# Patient Record
Sex: Male | Born: 1955 | Race: White | Hispanic: No | Marital: Single | Smoking: Current every day smoker
Health system: Southern US, Community
[De-identification: ages and names within clinical notes are randomized; demographics above are authoritative.]

## PROBLEM LIST (undated history)

## (undated) DIAGNOSIS — S5290XA Unspecified fracture of unspecified forearm, initial encounter for closed fracture: Secondary | ICD-10-CM

## (undated) DIAGNOSIS — S72009A Fracture of unspecified part of neck of unspecified femur, initial encounter for closed fracture: Secondary | ICD-10-CM

## (undated) DIAGNOSIS — K257 Chronic gastric ulcer without hemorrhage or perforation: Secondary | ICD-10-CM

## (undated) DIAGNOSIS — F1910 Other psychoactive substance abuse, uncomplicated: Secondary | ICD-10-CM

## (undated) DIAGNOSIS — I82409 Acute embolism and thrombosis of unspecified deep veins of unspecified lower extremity: Secondary | ICD-10-CM

## (undated) DIAGNOSIS — F200 Paranoid schizophrenia: Secondary | ICD-10-CM

## (undated) DIAGNOSIS — R339 Retention of urine, unspecified: Secondary | ICD-10-CM

## (undated) DIAGNOSIS — S86019A Strain of unspecified Achilles tendon, initial encounter: Secondary | ICD-10-CM

## (undated) HISTORY — DX: Acute embolism and thrombosis of unspecified deep veins of unspecified lower extremity: I82.409

## (undated) HISTORY — DX: Strain of unspecified achilles tendon, initial encounter: S86.019A

## (undated) HISTORY — PX: HIP SURGERY: SHX245

## (undated) HISTORY — DX: Other psychoactive substance abuse, uncomplicated: F19.10

## (undated) HISTORY — DX: Fracture of unspecified part of neck of unspecified femur, initial encounter for closed fracture: S72.009A

## (undated) HISTORY — DX: Unspecified fracture of unspecified forearm, initial encounter for closed fracture: S52.90XA

## (undated) HISTORY — DX: Retention of urine, unspecified: R33.9

---

## 2010-08-05 ENCOUNTER — Inpatient Hospital Stay (HOSPITAL_COMMUNITY)
Admission: EM | Admit: 2010-08-05 | Discharge: 2010-08-11 | DRG: 482 | Disposition: A | Discharge status: Home Health | Payer: Self-pay | Attending: Orthopaedic Surgery | Admitting: Orthopaedic Surgery

## 2010-08-05 DIAGNOSIS — Z5900 Homelessness unspecified: Secondary | ICD-10-CM

## 2010-08-05 DIAGNOSIS — R339 Retention of urine, unspecified: Secondary | ICD-10-CM | POA: Diagnosis present

## 2010-08-05 DIAGNOSIS — W101XXA Fall (on)(from) sidewalk curb, initial encounter: Secondary | ICD-10-CM | POA: Diagnosis present

## 2010-08-05 DIAGNOSIS — F17200 Nicotine dependence, unspecified, uncomplicated: Secondary | ICD-10-CM | POA: Diagnosis present

## 2010-08-05 DIAGNOSIS — S72143A Displaced intertrochanteric fracture of unspecified femur, initial encounter for closed fracture: Principal | ICD-10-CM | POA: Diagnosis present

## 2010-08-05 DIAGNOSIS — I10 Essential (primary) hypertension: Secondary | ICD-10-CM | POA: Diagnosis present

## 2010-08-06 ENCOUNTER — Emergency Department (HOSPITAL_COMMUNITY): Payer: Self-pay

## 2010-08-06 ENCOUNTER — Inpatient Hospital Stay (HOSPITAL_COMMUNITY): Payer: Self-pay

## 2010-08-06 LAB — COMPREHENSIVE METABOLIC PANEL
ALT: 19 U/L (ref 0–53)
Alkaline Phosphatase: 80 U/L (ref 39–117)
Chloride: 102 mEq/L (ref 96–112)
Glucose, Bld: 122 mg/dL — ABNORMAL HIGH (ref 70–99)
Potassium: 3.9 mEq/L (ref 3.5–5.1)
Sodium: 137 mEq/L (ref 135–145)
Total Bilirubin: 0.6 mg/dL (ref 0.3–1.2)
Total Protein: 7.5 g/dL (ref 6.0–8.3)

## 2010-08-06 LAB — DIFFERENTIAL
Basophils Relative: 0 % (ref 0–1)
Eosinophils Absolute: 0 10*3/uL (ref 0.0–0.7)
Eosinophils Relative: 0 % (ref 0–5)
Lymphs Abs: 1.8 10*3/uL (ref 0.7–4.0)
Monocytes Absolute: 0.9 10*3/uL (ref 0.1–1.0)
Monocytes Relative: 5 % (ref 3–12)

## 2010-08-06 LAB — MRSA PCR SCREENING: MRSA by PCR: NEGATIVE

## 2010-08-06 LAB — CBC
MCH: 30.7 pg (ref 26.0–34.0)
MCHC: 32.9 g/dL (ref 30.0–36.0)
MCV: 93.5 fL (ref 78.0–100.0)
Platelets: 650 10*3/uL — ABNORMAL HIGH (ref 150–400)
RBC: 3.71 MIL/uL — ABNORMAL LOW (ref 4.22–5.81)
RDW: 13.5 % (ref 11.5–15.5)

## 2010-08-06 LAB — PROTIME-INR: INR: 1.18 (ref 0.00–1.49)

## 2010-08-06 LAB — TYPE AND SCREEN: ABO/RH(D): A POS

## 2010-08-06 LAB — SAMPLE TO BLOOD BANK

## 2010-08-06 LAB — ABO/RH: ABO/RH(D): A POS

## 2010-08-07 LAB — URINALYSIS, ROUTINE W REFLEX MICROSCOPIC
Nitrite: NEGATIVE
Specific Gravity, Urine: 1.01 (ref 1.005–1.030)
Urobilinogen, UA: 1 mg/dL (ref 0.0–1.0)
pH: 6.5 (ref 5.0–8.0)

## 2010-08-07 LAB — URINE MICROSCOPIC-ADD ON

## 2010-08-08 LAB — CBC
MCHC: 33 g/dL (ref 30.0–36.0)
RDW: 13.1 % (ref 11.5–15.5)
WBC: 18.1 10*3/uL — ABNORMAL HIGH (ref 4.0–10.5)

## 2010-08-08 LAB — COMPREHENSIVE METABOLIC PANEL
ALT: 21 U/L (ref 0–53)
AST: 30 U/L (ref 0–37)
Albumin: 2.7 g/dL — ABNORMAL LOW (ref 3.5–5.2)
Calcium: 8.4 mg/dL (ref 8.4–10.5)
GFR calc Af Amer: >60 mL/min (ref >60)
Sodium: 132 mEq/L — ABNORMAL LOW (ref 135–145)
Total Protein: 6.5 g/dL (ref 6.0–8.3)

## 2010-08-08 LAB — MAGNESIUM: Magnesium: 1.8 mg/dL (ref 1.5–2.5)

## 2010-08-09 LAB — CBC
HCT: 25.1 % — ABNORMAL LOW (ref 39.0–52.0)
Hemoglobin: 8.2 g/dL — ABNORMAL LOW (ref 13.0–17.0)
MCH: 30.6 pg (ref 26.0–34.0)
MCHC: 32.7 g/dL (ref 30.0–36.0)
MCV: 93.7 fL (ref 78.0–100.0)

## 2010-08-09 LAB — URINE CULTURE: Culture: NO GROWTH

## 2010-08-11 DIAGNOSIS — S72009A Fracture of unspecified part of neck of unspecified femur, initial encounter for closed fracture: Secondary | ICD-10-CM

## 2010-08-11 HISTORY — DX: Fracture of unspecified part of neck of unspecified femur, initial encounter for closed fracture: S72.009A

## 2010-08-11 LAB — VITAMIN D 1,25 DIHYDROXY: Vitamin D3 1, 25 (OH)2: 35 pg/mL

## 2010-08-16 NOTE — Op Note (Signed)
NAME:  YACINE, GARRIGA NO.:  [id number]  MEDICAL RECORD NO.:  [id number]           PATIENT TYPE:  I  LOCATION:  5025                         FACILITY:  [facility]  PHYSICIAN:  Vanita Panda. Magnus Ivan, M.D.DATE OF BIRTH:  05/18/1956  DATE OF PROCEDURE:  08/06/2010 DATE OF DISCHARGE:                              OPERATIVE REPORT   PREOPERATIVE DIAGNOSIS:  Right intertrochanteric hip/femur fracture.  POSTOPERATIVE DIAGNOSIS:  Right intertrochanteric hip/femur fracture.  PROCEDURE:  Open reduction and internal fixation of right intertrochanteric hip fracture with intramedullary hip screw.  IMPLANTS:  [employer] 10 x 38 right intramedullary hip screw nail with 95-mm lag screw.  SURGEON:  Vanita Panda. Magnus Ivan, MD  ANESTHESIA:  General.  ANTIBIOTICS:  1 g of IV Ancef.  BLOOD LOSS:  100 mL.  COMPLICATIONS:  None.  INDICATIONS:  Mr. Livingood is a 55 year old gentleman who is homeless.  He stepped off a curb broom in the early morning hours as he was going to a homeless shelter and he sustained an injury to his right hip.  He was brought to the [facility] Room and found to have intertrochanteric hip/femur fracture.  Due to the nature of this injury, we recommended he undergo open reduction and internal fixation of the fracture.  The risks and benefits of this were explained to him at length and he did wish to proceed to surgery.  PROCEDURE DESCRIPTION:  After informed consent was obtained, appropriate right hip was marked.  Mr. Spahr was brought to the operating room and placed supine on the operating fracture table.  General anesthesia was then obtained.  A peroneal post was obtained between his legs with good padding.  His right operative leg was placed on traction using traction boot and the left nonoperative hip was flexed and abducted out of the field and placed in the stirrup with appropriate padding under the popliteal fossa.  A time-out  was called and he was identified as correct patient correct right hip.  We then prepped the hip with DuraPrep and sterile drapes including a sterile shower curtain.  An incision was made proximal to the greater trochanter on the lateral thigh and carried down to the tip of the greater trochanter.  Under direct fluoroscopic guidance, I placed a guidepin in an antegrade fashion from the greater trochanter down to the lesser trochanter.  I then used initiating reamer and opened up the femoral canal.  We then placed a guidewire and took measurement and showed a 10 x 38 intramedullary hip screw.  Of note, we did have the fracture reduced prior to making the incision under direct fluoroscopic guidance and this was found to be a stable intertrochanteric fracture.  I did not feel the necessary to ream and I placed the long nail in an antegrade fashion in the femoral canal easily.  Using the outrigger guide, we then made a separate incision a little bit more distally in the thigh and placed a guidepin to the lateral cortex traversing the fracture as well as the femoral neck into the femoral head and it was felt this was in good position.  We  took a measurement of this and placed a 95-mm lag screw.  We let the traction down and we placed a compression component as well to compress the fracture and it was again a stable fracture.  We then removed all instrument and the outrigger guide.  I copiously irrigated 2 small wounds and closed each one with deep 0 Vicryl followed by 2-0 Vicryl on the subcutaneous tissue and interrupted staples on the skin.  Xeroform and Mepilex dressing was applied and the patient was taken off fracture table, awakened, extubated, and taken to the recovery room in stable condition.  Postoperatively, this was a stable fracture.  We will allow him weightbearing as tolerated and we will likely have him in the hospital for a few days with consultation with Social Work  about placement as he has healed this fracture.     Vanita Panda. Magnus Ivan, M.D.     CYB/MEDQ  D:  08/06/2010  T:  08/07/2010  Job:  [id number]  Electronically Signed by Doneen Poisson M.D. on 08/09/2010 05:20:54 PM

## 2010-08-18 NOTE — Discharge Summary (Signed)
  NAME:  [name], [name] NO.:  [id number]  MEDICAL RECORD NO.:  [id number]           PATIENT TYPE:  I  LOCATION:  5025                         FACILITY:  [facility]  PHYSICIAN:  Vanita Panda. Magnus Ivan, M.D.DATE OF BIRTH:  1955-12-20  DATE OF ADMISSION:  08/05/2010 DATE OF DISCHARGE:  08/05/2010                              DISCHARGE SUMMARY   ADMITTING DIAGNOSIS:  Right intertrochanteric hip/femur fracture.  DISCHARGE DIAGNOSIS:  Right intertrochanteric hip/femur fracture.  PROCEDURES:  Open reduction and internal fixation of right hip fracture on August 06, 2010.  HOSPITAL COURSE:  [name] is a 55 year old homeless individual who fell on his way to the homeless shelter injuring his right hip.  He had a previous left hip fracture about 5 or 6 years ago that went untreated. He had since healed that fracture, but it makes him unsteady on his gait.  He had accidental fall on the night of admission.  He was taken to the operating room on August 06, 2010, where he underwent successful open reduction and internal fixation of the hip.  He was admitted to the inpatient orthopedic floor and he has had a uneventful hospital convalescence.  He did have some problems with urinary retention, but this did resolve.  It was felt that he could not be discharged safely back out to the streets prior to needing some further rehabilitation and skilled nursing.  By the day of discharge, incision was clean, dry, and intact.  He was afebrile with stable vital signs and was ambulating with a walker.  DISPOSITION:  To skilled nursing facility.  DISCHARGE INSTRUCTIONS:  While at skilled nursing facility, they can work with balanced gait training, coordination, and strengthening of his right lower extremity.  He can get his incision wet in the shower and a dry dressing should be applied daily to the right hip.  He should follow up in the office at [facility] in 2  weeks.  DISCHARGE MEDICATIONS: 1. Percocet p.r.n. pain. 2. Robaxin p.r.n. spasms. 3. 24-hour nicotine patch daily. 4. Stool softener twice daily as needed. 5. Aspirin 325 mg daily.    Vanita Panda. Magnus Ivan, M.D.    CYB/MEDQ  D:  08/10/2010  T:  08/10/2010  Job:  [id number]  Electronically Signed by Doneen Poisson M.D. on 08/18/2010 12:56:38 PM

## 2010-10-09 ENCOUNTER — Emergency Department (HOSPITAL_COMMUNITY)
Admission: EM | Admit: 2010-10-09 | Discharge: 2010-10-09 | Disposition: A | Discharge status: Home / Self Care | Payer: Self-pay | Attending: Emergency Medicine | Admitting: Emergency Medicine

## 2010-10-09 DIAGNOSIS — IMO0002 Reserved for concepts with insufficient information to code with codable children: Secondary | ICD-10-CM | POA: Insufficient documentation

## 2010-10-09 DIAGNOSIS — S0990XA Unspecified injury of head, initial encounter: Secondary | ICD-10-CM | POA: Insufficient documentation

## 2010-10-09 DIAGNOSIS — Z96649 Presence of unspecified artificial hip joint: Secondary | ICD-10-CM | POA: Insufficient documentation

## 2010-11-01 ENCOUNTER — Emergency Department (HOSPITAL_COMMUNITY)
Admission: EM | Admit: 2010-11-01 | Discharge: 2010-11-01 | Disposition: A | Discharge status: Home / Self Care | Payer: Self-pay | Attending: Emergency Medicine | Admitting: Emergency Medicine

## 2010-11-01 ENCOUNTER — Emergency Department (HOSPITAL_COMMUNITY): Payer: Self-pay

## 2010-11-01 DIAGNOSIS — S6990XA Unspecified injury of unspecified wrist, hand and finger(s), initial encounter: Secondary | ICD-10-CM | POA: Insufficient documentation

## 2010-11-01 DIAGNOSIS — W010XXA Fall on same level from slipping, tripping and stumbling without subsequent striking against object, initial encounter: Secondary | ICD-10-CM | POA: Insufficient documentation

## 2010-11-01 DIAGNOSIS — M25539 Pain in unspecified wrist: Secondary | ICD-10-CM | POA: Insufficient documentation

## 2010-11-01 DIAGNOSIS — S59909A Unspecified injury of unspecified elbow, initial encounter: Secondary | ICD-10-CM | POA: Insufficient documentation

## 2010-11-01 DIAGNOSIS — S52599A Other fractures of lower end of unspecified radius, initial encounter for closed fracture: Secondary | ICD-10-CM | POA: Insufficient documentation

## 2010-11-01 LAB — POCT I-STAT, CHEM 8
BUN: 16 mg/dL (ref 6–23)
Creatinine, Ser: 1.1 mg/dL (ref 0.4–1.5)
Glucose, Bld: 90 mg/dL (ref 70–99)
HCT: 32 % — ABNORMAL LOW (ref 39.0–52.0)
Hemoglobin: 10.9 g/dL — ABNORMAL LOW (ref 13.0–17.0)

## 2010-11-02 ENCOUNTER — Emergency Department (HOSPITAL_COMMUNITY): Payer: Self-pay

## 2010-11-02 ENCOUNTER — Inpatient Hospital Stay (HOSPITAL_COMMUNITY)
Admission: EM | Admit: 2010-11-02 | Discharge: 2010-11-06 | DRG: 897 | Disposition: A | Discharge status: Home / Self Care | Payer: Self-pay | Attending: Family Medicine | Admitting: Family Medicine

## 2010-11-02 DIAGNOSIS — J449 Chronic obstructive pulmonary disease, unspecified: Secondary | ICD-10-CM | POA: Diagnosis present

## 2010-11-02 DIAGNOSIS — R197 Diarrhea, unspecified: Secondary | ICD-10-CM | POA: Diagnosis present

## 2010-11-02 DIAGNOSIS — F1020 Alcohol dependence, uncomplicated: Principal | ICD-10-CM | POA: Diagnosis present

## 2010-11-02 DIAGNOSIS — D509 Iron deficiency anemia, unspecified: Secondary | ICD-10-CM | POA: Diagnosis present

## 2010-11-02 DIAGNOSIS — D72829 Elevated white blood cell count, unspecified: Secondary | ICD-10-CM | POA: Diagnosis present

## 2010-11-02 DIAGNOSIS — F17200 Nicotine dependence, unspecified, uncomplicated: Secondary | ICD-10-CM | POA: Diagnosis present

## 2010-11-02 DIAGNOSIS — J4489 Other specified chronic obstructive pulmonary disease: Secondary | ICD-10-CM | POA: Diagnosis present

## 2010-11-02 DIAGNOSIS — Z9181 History of falling: Secondary | ICD-10-CM

## 2010-11-02 DIAGNOSIS — Z5900 Homelessness unspecified: Secondary | ICD-10-CM

## 2010-11-02 DIAGNOSIS — E876 Hypokalemia: Secondary | ICD-10-CM | POA: Diagnosis present

## 2010-11-02 LAB — BASIC METABOLIC PANEL
CO2: 23 mEq/L (ref 19–32)
Calcium: 9.3 mg/dL (ref 8.4–10.5)
Chloride: 100 mEq/L (ref 96–112)
GFR calc Af Amer: >60 mL/min (ref >60)
Glucose, Bld: 89 mg/dL (ref 70–99)
Sodium: 138 mEq/L (ref 135–145)

## 2010-11-02 LAB — DIFFERENTIAL
Basophils Absolute: 0 10*3/uL (ref 0.0–0.1)
Eosinophils Relative: 0 % (ref 0–5)
Lymphocytes Relative: 6 % — ABNORMAL LOW (ref 12–46)
Lymphs Abs: 1.2 10*3/uL (ref 0.7–4.0)
Neutro Abs: 17.9 10*3/uL — ABNORMAL HIGH (ref 1.7–7.7)

## 2010-11-02 LAB — URINALYSIS, ROUTINE W REFLEX MICROSCOPIC
Glucose, UA: NEGATIVE mg/dL
Leukocytes, UA: NEGATIVE
Protein, ur: 100 mg/dL — AB
Specific Gravity, Urine: 1.021 (ref 1.005–1.030)
pH: 6 (ref 5.0–8.0)

## 2010-11-02 LAB — CBC
HCT: 33 % — ABNORMAL LOW (ref 39.0–52.0)
Hemoglobin: 10.5 g/dL — ABNORMAL LOW (ref 13.0–17.0)
MCV: 89.4 fL (ref 78.0–100.0)
WBC: 20 10*3/uL — ABNORMAL HIGH (ref 4.0–10.5)

## 2010-11-02 LAB — URINE MICROSCOPIC-ADD ON

## 2010-11-03 LAB — IRON AND TIBC
Iron: 26 ug/dL — ABNORMAL LOW (ref 42–135)
Saturation Ratios: 16 % — ABNORMAL LOW (ref 20–55)
UIBC: 141 ug/dL

## 2010-11-03 LAB — COMPREHENSIVE METABOLIC PANEL
ALT: 19 U/L (ref 0–53)
AST: 60 U/L — ABNORMAL HIGH (ref 0–37)
Albumin: 2.7 g/dL — ABNORMAL LOW (ref 3.5–5.2)
CO2: 22 mEq/L (ref 19–32)
Chloride: 100 mEq/L (ref 96–112)
Creatinine, Ser: 0.85 mg/dL (ref 0.4–1.5)
GFR calc Af Amer: >60 mL/min (ref >60)
GFR calc non Af Amer: >60 mL/min (ref >60)
Potassium: 3.4 mEq/L — ABNORMAL LOW (ref 3.5–5.1)
Sodium: 135 mEq/L (ref 135–145)
Total Bilirubin: 0.3 mg/dL (ref 0.3–1.2)

## 2010-11-03 LAB — CBC
Hemoglobin: 9.4 g/dL — ABNORMAL LOW (ref 13.0–17.0)
Platelets: 519 10*3/uL — ABNORMAL HIGH (ref 150–400)
RBC: 3.3 MIL/uL — ABNORMAL LOW (ref 4.22–5.81)
WBC: 16.5 10*3/uL — ABNORMAL HIGH (ref 4.0–10.5)

## 2010-11-03 LAB — FOLATE: Folate: >20 ng/mL

## 2010-11-03 LAB — FERRITIN: Ferritin: 682 ng/mL — ABNORMAL HIGH (ref 22–322)

## 2010-11-03 LAB — MRSA PCR SCREENING: MRSA by PCR: NEGATIVE

## 2010-11-03 LAB — URINE DRUGS OF ABUSE SCREEN W ALC, ROUTINE (REF LAB)
Barbiturate Quant, Ur: NEGATIVE
Benzodiazepines.: NEGATIVE
Marijuana Metabolite: NEGATIVE
Phencyclidine (PCP): NEGATIVE
Propoxyphene: NEGATIVE

## 2010-11-03 LAB — VITAMIN B12: Vitamin B-12: 292 pg/mL (ref 211–911)

## 2010-11-04 LAB — CBC
Platelets: 526 10*3/uL — ABNORMAL HIGH (ref 150–400)
RBC: 3.05 MIL/uL — ABNORMAL LOW (ref 4.22–5.81)
RDW: 15.2 % (ref 11.5–15.5)
WBC: 11.5 10*3/uL — ABNORMAL HIGH (ref 4.0–10.5)

## 2010-11-04 LAB — CLOSTRIDIUM DIFFICILE BY PCR: Toxigenic C. Difficile by PCR: NEGATIVE

## 2010-11-04 LAB — BASIC METABOLIC PANEL
Chloride: 108 mEq/L (ref 96–112)
GFR calc Af Amer: >60 mL/min (ref >60)
GFR calc non Af Amer: >60 mL/min (ref >60)
Potassium: 3.7 mEq/L (ref 3.5–5.1)
Sodium: 139 mEq/L (ref 135–145)

## 2010-11-04 LAB — URINE CULTURE
Colony Count: 45000
Culture  Setup Time: 201205030637

## 2010-11-05 LAB — DIFFERENTIAL
Eosinophils Absolute: 0.3 10*3/uL (ref 0.0–0.7)
Lymphocytes Relative: 27 % (ref 12–46)
Lymphs Abs: 3.2 10*3/uL (ref 0.7–4.0)
Neutro Abs: 7.4 10*3/uL (ref 1.7–7.7)
Neutrophils Relative %: 62 % (ref 43–77)

## 2010-11-05 LAB — CBC
Hemoglobin: 9.5 g/dL — ABNORMAL LOW (ref 13.0–17.0)
MCH: 28.5 pg (ref 26.0–34.0)
MCV: 89.8 fL (ref 78.0–100.0)
RBC: 3.33 MIL/uL — ABNORMAL LOW (ref 4.22–5.81)
WBC: 12 10*3/uL — ABNORMAL HIGH (ref 4.0–10.5)

## 2010-11-06 NOTE — H&P (Signed)
NAME:  [name], [name]              ACCOUNT NO.:  [id number]  MEDICAL RECORD NO.:  [id number]           PATIENT TYPE:  I  LOCATION:  1511                         FACILITY:  [facility]  PHYSICIAN:  Lonia Blood, M.D.      DATE OF BIRTH:  1955/09/26  DATE OF ADMISSION:  11/02/2010 DATE OF DISCHARGE:                             HISTORY & PHYSICAL   PRIMARY CARE PHYSICIAN:  He is unassigned.  PRESENTING COMPLAINT:  Generalized weakness.  HISTORY OF PRESENT ILLNESS:  The patient is a 55 year old homeless alcoholic man who has been to the ED several times in the last month with recurrent falls.  He sustained multiple fractures from the fall including most recently a wrist fracture.  He was only followed by Orthopedics.  During previous visits, the patient was found to have persistent elevated white blood count, but that has never been worked up as the patient is not following with any particular physician.  He has problem being homeless again and does not have any physician to follow up with.  He is an alcoholic, however, he has not drank since Monday. He also smokes excessively.  The patient was seemed to be orthostatic and feeling dizzy for the most part.  He had a right intertrochanteric femoral fracture in February this year after he fell and was sent to skilled facility, but not sure what happened as the patient left there. He was seen there by Dr. Doneen Poisson.  PAST MEDICAL HISTORY:  Mainly alcohol dependence, tobacco dependence, and multiple fractures.  ALLERGIES:  He has no known drug allergies.  MEDICATIONS:  None at the moment.  SOCIAL HISTORY:  The patient is homeless.  He smokes about a pack per day, but depends on when he gets it.  He has not smoked in 2 days.  He also drinks heavily anything that he can find, mainly beer and wine.  He denied any IV drug use.  FAMILY HISTORY:  Denied any significant family history that he is aware of.  REVIEW OF SYSTEMS:  Just  generalized tiredness and weakness, otherwise, all systems reviewed are negative except per HPI.  PHYSICAL EXAMINATION:  VITAL SIGNS:  Temperature is 98.0, blood pressure 139/67, his pulse 103, respiratory rate 18, sats 98% on room air. GENERAL:  He looks very disheveled, unkempt with hair all over, but no acute distress. HEENT:  PERRL.  EOMI.  No pallor.  No jaundice.  No rhinorrhea.  He has very poor dirty dentition. NECK:  Supple.  No JVD.  No lymphadenopathy. RESPIRATORY:  He has good air entry bilaterally.  No wheezes, no rales, no crackles. CARDIOVASCULAR SYSTEM:  He is tachycardic. ABDOMEN:  Flat, soft, and nontender with positive bowel sounds. EXTREMITIES:  He has no edema, cyanosis, or clubbing. SKIN:  He has no rashes.  No ulcers, but generally unkempt. NEUROLOGIC:  Cranial nerves II through XII seem to be intact.  He has poor coordination, but no focal deficit.  His gait is poor with mainly ataxic.  Otherwise, power is 5/5 upper and lower extremities respectively and reflex 2+ bilaterally.  LABORATORY DATA:  His white count is 20,000 with  left shift, ANC of 17.9, hemoglobin is 10.5 with MCV of 89, and platelet count of 591. Alcohol level is less than 11.  Sodium 138, potassium 4.1, chloride 100, CO2 23, glucose 89, BUN 20, creatinine 1.11, and calcium 9.3.  His magnesium level 2.3.  Urinalysis showed trace ketones and moderate blood as well as some proteinuria.  Urine microscopy showed mucous and sperm present.  Chest x-ray showed chronic lung disease with biapical bullous emphysema and destruction of the left base.  No superimposed acute findings. Right hip x-ray showed posttraumatic deformity of both proximal femurs related to healed intertrochanteric fracture.  ASSESSMENT:  This is a 55 year old homeless man presenting with recurrent falls and ataxia.  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  He denied  any altered mental status when he fell.  His legs just give on him.  He therefore has some gait instability.  There are also recent fractures, which are healing.  He has persistent leukocytosis with left shift.  The cause is not entirely clear, probably chronic infection, but there is no adequate source of infection at this point, so we cannot rule out some bone marrow disease.  He has chronic obstructive pulmonary disease, alcohol dependence, tobacco dependence, and normocytic anemia. 1. Gait instability.  We will admit the patient, work him up for other     possible causes.  We may consider MRI of the brain as well, get     PT/OT to evaluate the patient.  Depending on findings, the patient     may need to be placed in the skilled facility until his gait is     stabilized. 2. Recurrent falls.  Again, due to probably gait instability, we will     get an extensive PT/OT and full evaluation again prior to     discharge.  Persistent leukocytosis.  This could be secondary to     the chronic fractures that the patient is having leading to some     inflammation, but could also be some occult infection.  Bone marrow     disease cannot be ruled out.  We will consider hematology input to     see if we really need to workout this increase in white count,     especially if it persists. 3. Chronic obstructive pulmonary disease.  I will put him on empiric     nebulizers, but he is not wheezing at this point. 4. Alcohol dependence.  I will start him CIWA protocol especially     since he has not drank so far in 48 hours and his alcohol level is     low, he might go into delirium tremens any time from now. 5. Tobacco dependence.  The patient will be counseled and I will offer     him nicotine patch as needed. 6. Normocytic anemia.  I will check anemia panel, but more than likely     this is related to his chronic alcoholism and poor intake.     Lonia Blood, M.D.     Verlin Grills  D:  11/03/2010  T:   11/03/2010  Job:  [id number]  Electronically Signed by Lonia Blood M.D. on 11/06/2010 10:09:38 PM

## 2010-11-08 LAB — STOOL CULTURE

## 2010-11-09 DIAGNOSIS — S5290XA Unspecified fracture of unspecified forearm, initial encounter for closed fracture: Secondary | ICD-10-CM

## 2010-11-09 HISTORY — DX: Unspecified fracture of unspecified forearm, initial encounter for closed fracture: S52.90XA

## 2010-11-09 LAB — CULTURE, BLOOD (ROUTINE X 2)
Culture  Setup Time: 201205030825
Culture  Setup Time: 201205030825
Culture: NO GROWTH

## 2010-11-14 ENCOUNTER — Emergency Department (HOSPITAL_COMMUNITY)
Admission: EM | Admit: 2010-11-14 | Discharge: 2010-11-14 | Disposition: A | Discharge status: Home / Self Care | Payer: Self-pay | Attending: Emergency Medicine | Admitting: Emergency Medicine

## 2010-11-14 DIAGNOSIS — W010XXA Fall on same level from slipping, tripping and stumbling without subsequent striking against object, initial encounter: Secondary | ICD-10-CM | POA: Insufficient documentation

## 2010-11-14 DIAGNOSIS — S0100XA Unspecified open wound of scalp, initial encounter: Secondary | ICD-10-CM | POA: Insufficient documentation

## 2010-11-14 DIAGNOSIS — Z23 Encounter for immunization: Secondary | ICD-10-CM | POA: Insufficient documentation

## 2010-11-14 NOTE — Discharge Summary (Signed)
NAME:  [name], [name]              ACCOUNT NO.:  [id number]  MEDICAL RECORD NO.:  [id number]           PATIENT TYPE:  I  LOCATION:  1511                         FACILITY:  [facility]  PHYSICIAN:  Kela Millin, M.D.DATE OF BIRTH:  1956/05/09  DATE OF ADMISSION:  11/02/2010 DATE OF DISCHARGE:  11/06/2010                        DISCHARGE SUMMARY - REFERRING   DISCHARGE DIAGNOSES: 1. Alcohol dependence. 2. Gait instability with recurrent falls and fractures in the past.     He has declined skilled nursing placement and also declined social     worker setting him up with shelters or any other services. 3. Chronic obstructive pulmonary disease, stable. 4. Tobacco dependence, counseled to quit. 5. Normocytic anemia with iron deficiency component per anemia panel.     The patient to be referred to GI outpatient on followup with MD at     [facility]. 6. Leukocytosis.  Infection workup negative and white cell count     improved to 12 prior to discharge, on no antibiotics, possibly     secondary to stress demargination. 7. Homelessness.  As above, the patient declined skilled nursing,     shelter services. 8. History of right intertrochanteric hip/femur fracture. 9. Recent right radial fracture.  Follow up outpatient.  PROCEDURES AND STUDIES:  Right hip x-ray on 05/02:  Post-traumatic deformity of both proximal femurs related to healed intertrochanteric fractures.  The distal end of the right femoral intramedullary rod is not imaged.  No osseous findings are seen. Chest x-ray:  Chronic lung disease with biapical bullous emphysema and architectural distortion of the left base.  No superimposed acute findings are identified.  BRIEF HISTORY:  The patient is a 55 year old homeless alcoholic white male with the above listed medical problems who was noted to have been in the ED several times in the past month with recurrent falls.  It was noted that most recently he had sustained a right  wrist fracture and he was followed by Orthopedics.  The patient was found to have an elevated white cell count in the ED and it was noted that on his previous visits it was elevated as well.  The patient has not been followed by a primary care physician.  As already mentioned, he is alcoholic but he stated that he had not drank any alcohol for about 2 to 3 days prior to admission.  In the ED, he was complaining of feeling dizzy.  It was noted that after his right intertrochanteric femoral fracture in February after a fall, he was sent to a skilled nursing facility but the patient subsequently left that facility and he was followed there by Dr. Magnus Ivan.  HOSPITAL COURSE: 1. Alcohol dependence:  The patient was placed on Ativan detox     protocol on admission.  He did not show any signs of withdrawal     throughout this hospitalization.  He was also placed on     multivitamins, thiamine and folic acid which he is to continue upon     discharge.  He was counseled to quit alcohol but he has declined     all services offered by the social worker. 2.  Gait instability/frequent falls with fractures in the past:  As     discussed above, PT, OT was consulted on admission and they saw the     patient and recommended skilled nursing.  He has declined skilled     nursing and has also declined any other services and states that he     will return to the abandoned car that he lives in.  He is alert and     has been lucid during this hospital stay.  He has been set up with     a followup appointment at [facility] and he is also to follow up     with orthopedics, Dr. Doneen Poisson. 3. Leukocytosis:  Upon admission, he was noted to have a white cell     count of 20 and a chest x-ray was done which did not show any acute     findings.  Urinalysis also was done and came back negative.  The     patient had blood cultures done and had episodes of diarrhea in the     hospital and so C diff was also  done.  The blood cultures have come     back with no growth and his C diff PCR has come back negative.  The     patient was not treated with any antibiotics and his leukocytosis     improved to 12.  It is likely that this is secondary to stress     demargination.  He is to follow up outpatient at [facility]. 4. Normocytic anemia/slash iron-deficiency anemia:  On admission, the     patient was noted to be anemic, hemoglobin of 10.5, hematocrit of     3, and on previous visits he has also been found to be anemic, last     hemoglobin 8.2 in February.  The patient had an anemia panel done     and it came back showing an iron of 26 with TIBC of 167, percent     sat of 16, vitamin B12 level normal at 292,  serum folate is     greater than 20, and ferritin 682.  He has been started on     supplemental iron for the iron deficiency component of this anemia     but, as noted, he has chronic disease anemia as well and his     alcoholism is likely contributing factor to that.  The patient is     to follow up outpatient at [facility] and be referred to GI     outpatient for further evaluation/possible colonoscopy. 5. Chronic obstructive pulmonary disease:  Counseled to quit.  And,     again, the patient did not accept any social work services offered.     He has been discharged on Combivent.  DISCHARGE MEDICATIONS: 1. Combivent 2 puffs q.i.d. 2. Folic acid 1 mg p.o. daily. 3. Iron sulfate 325 mg p.o. b.i.d. 4. Multivitamins 1 capsule p.o. daily. 5. Oxycodone 5 mg p.o. q.4 h p.r.n. 6. Thiamine 100 mg p.o. daily.  FOLLOWUP CARE: 1. [facility], as scheduled on Nov 24, 2010, at 3:30 p.m. for     eligibility meeting and on December 22, 2010, at 3 p.m. for appointment     with Dr. Audria Nine. 2. Dr. Doneen Poisson, as scheduled. 3. Outpatient GI referral upon followup at [facility], as discussed     above.  DISCHARGE CONDITION:  Stable.     Kela Millin, M.D.     ACV/MEDQ  D:  11/06/2010  T:  11/06/2010  Job:  [id number]  cc:   Vanita Panda. Magnus Ivan, M.D. Fax: [phone]  Maurice March, M.D. Fax: [phone]  Electronically Signed by Donnalee Curry M.D. on 11/14/2010 11:36:12 AM

## 2010-11-17 ENCOUNTER — Emergency Department (HOSPITAL_COMMUNITY): Payer: Self-pay

## 2010-11-17 ENCOUNTER — Inpatient Hospital Stay (HOSPITAL_COMMUNITY)
Admission: EM | Admit: 2010-11-17 | Discharge: 2010-11-25 | DRG: 603 | Disposition: A | Discharge status: Nursing Home (Includes ICF) | Payer: Self-pay | Attending: Internal Medicine | Admitting: Internal Medicine

## 2010-11-17 DIAGNOSIS — R279 Unspecified lack of coordination: Secondary | ICD-10-CM | POA: Diagnosis present

## 2010-11-17 DIAGNOSIS — F1410 Cocaine abuse, uncomplicated: Secondary | ICD-10-CM | POA: Diagnosis present

## 2010-11-17 DIAGNOSIS — D72829 Elevated white blood cell count, unspecified: Secondary | ICD-10-CM | POA: Diagnosis present

## 2010-11-17 DIAGNOSIS — Z5900 Homelessness unspecified: Secondary | ICD-10-CM

## 2010-11-17 DIAGNOSIS — E876 Hypokalemia: Secondary | ICD-10-CM | POA: Diagnosis present

## 2010-11-17 DIAGNOSIS — D509 Iron deficiency anemia, unspecified: Secondary | ICD-10-CM | POA: Diagnosis present

## 2010-11-17 DIAGNOSIS — L02419 Cutaneous abscess of limb, unspecified: Principal | ICD-10-CM | POA: Diagnosis present

## 2010-11-17 DIAGNOSIS — Z4789 Encounter for other orthopedic aftercare: Secondary | ICD-10-CM

## 2010-11-17 DIAGNOSIS — E871 Hypo-osmolality and hyponatremia: Secondary | ICD-10-CM | POA: Diagnosis present

## 2010-11-17 DIAGNOSIS — R5381 Other malaise: Secondary | ICD-10-CM | POA: Diagnosis present

## 2010-11-17 DIAGNOSIS — F1210 Cannabis abuse, uncomplicated: Secondary | ICD-10-CM | POA: Diagnosis present

## 2010-11-17 DIAGNOSIS — F1020 Alcohol dependence, uncomplicated: Secondary | ICD-10-CM | POA: Diagnosis present

## 2010-11-17 DIAGNOSIS — Z9181 History of falling: Secondary | ICD-10-CM

## 2010-11-17 DIAGNOSIS — L03119 Cellulitis of unspecified part of limb: Principal | ICD-10-CM | POA: Diagnosis present

## 2010-11-17 DIAGNOSIS — R339 Retention of urine, unspecified: Secondary | ICD-10-CM | POA: Diagnosis not present

## 2010-11-17 DIAGNOSIS — J449 Chronic obstructive pulmonary disease, unspecified: Secondary | ICD-10-CM | POA: Diagnosis present

## 2010-11-17 DIAGNOSIS — T3695XA Adverse effect of unspecified systemic antibiotic, initial encounter: Secondary | ICD-10-CM | POA: Diagnosis not present

## 2010-11-17 DIAGNOSIS — Z4802 Encounter for removal of sutures: Secondary | ICD-10-CM

## 2010-11-17 DIAGNOSIS — R197 Diarrhea, unspecified: Secondary | ICD-10-CM | POA: Diagnosis not present

## 2010-11-17 DIAGNOSIS — F17200 Nicotine dependence, unspecified, uncomplicated: Secondary | ICD-10-CM | POA: Diagnosis present

## 2010-11-17 DIAGNOSIS — D473 Essential (hemorrhagic) thrombocythemia: Secondary | ICD-10-CM | POA: Diagnosis present

## 2010-11-17 DIAGNOSIS — J4489 Other specified chronic obstructive pulmonary disease: Secondary | ICD-10-CM | POA: Diagnosis present

## 2010-11-17 LAB — URINE MICROSCOPIC-ADD ON

## 2010-11-17 LAB — ETHANOL: Alcohol, Ethyl (B): <11 mg/dL — ABNORMAL HIGH (ref 0–10)

## 2010-11-17 LAB — URINALYSIS, ROUTINE W REFLEX MICROSCOPIC
Glucose, UA: NEGATIVE mg/dL
Protein, ur: NEGATIVE mg/dL
pH: 5 (ref 5.0–8.0)

## 2010-11-17 LAB — RAPID URINE DRUG SCREEN, HOSP PERFORMED
Barbiturates: NOT DETECTED
Benzodiazepines: NOT DETECTED
Cocaine: POSITIVE — AB

## 2010-11-17 LAB — DIFFERENTIAL
Basophils Relative: 0 % (ref 0–1)
Eosinophils Absolute: 0 10*3/uL (ref 0.0–0.7)
Lymphs Abs: 2.6 10*3/uL (ref 0.7–4.0)
Monocytes Absolute: 1.3 10*3/uL — ABNORMAL HIGH (ref 0.1–1.0)
Monocytes Relative: 10 % (ref 3–12)
Neutro Abs: 9.6 10*3/uL — ABNORMAL HIGH (ref 1.7–7.7)
Neutrophils Relative %: 71 % (ref 43–77)

## 2010-11-17 LAB — CBC
Hemoglobin: 10 g/dL — ABNORMAL LOW (ref 13.0–17.0)
MCH: 28 pg (ref 26.0–34.0)
MCHC: 31.4 g/dL (ref 30.0–36.0)
MCV: 89.1 fL (ref 78.0–100.0)
RBC: 3.57 MIL/uL — ABNORMAL LOW (ref 4.22–5.81)

## 2010-11-17 LAB — COMPREHENSIVE METABOLIC PANEL
ALT: 24 U/L (ref 0–53)
AST: 29 U/L (ref 0–37)
Albumin: 3.4 g/dL — ABNORMAL LOW (ref 3.5–5.2)
Alkaline Phosphatase: 157 U/L — ABNORMAL HIGH (ref 39–117)
Calcium: 8.7 mg/dL (ref 8.4–10.5)
GFR calc Af Amer: >60 mL/min (ref >60)
Glucose, Bld: 83 mg/dL (ref 70–99)
Potassium: 3.6 mEq/L (ref 3.5–5.1)
Sodium: 133 mEq/L — ABNORMAL LOW (ref 135–145)
Total Protein: 7.4 g/dL (ref 6.0–8.3)

## 2010-11-19 LAB — BASIC METABOLIC PANEL
BUN: 11 mg/dL (ref 6–23)
CO2: 24 mEq/L (ref 19–32)
Calcium: 8.7 mg/dL (ref 8.4–10.5)
Glucose, Bld: 96 mg/dL (ref 70–99)
Potassium: 3.1 mEq/L — ABNORMAL LOW (ref 3.5–5.1)
Sodium: 142 mEq/L (ref 135–145)

## 2010-11-19 LAB — DIFFERENTIAL
Basophils Absolute: 0 10*3/uL (ref 0.0–0.1)
Lymphocytes Relative: 21 % (ref 12–46)
Lymphs Abs: 2.7 10*3/uL (ref 0.7–4.0)
Monocytes Absolute: 1.2 10*3/uL — ABNORMAL HIGH (ref 0.1–1.0)
Neutro Abs: 8.8 10*3/uL — ABNORMAL HIGH (ref 1.7–7.7)

## 2010-11-19 LAB — CBC
HCT: 28 % — ABNORMAL LOW (ref 39.0–52.0)
Hemoglobin: 8.8 g/dL — ABNORMAL LOW (ref 13.0–17.0)
MCHC: 31.4 g/dL (ref 30.0–36.0)
MCV: 89.7 fL (ref 78.0–100.0)

## 2010-11-20 ENCOUNTER — Inpatient Hospital Stay (HOSPITAL_COMMUNITY): Payer: Self-pay

## 2010-11-20 LAB — BASIC METABOLIC PANEL
BUN: 8 mg/dL (ref 6–23)
CO2: 27 mEq/L (ref 19–32)
Calcium: 9.3 mg/dL (ref 8.4–10.5)
Creatinine, Ser: 0.67 mg/dL (ref 0.4–1.5)
Glucose, Bld: 95 mg/dL (ref 70–99)

## 2010-11-20 LAB — CBC
HCT: 27.1 % — ABNORMAL LOW (ref 39.0–52.0)
Hemoglobin: 8.5 g/dL — ABNORMAL LOW (ref 13.0–17.0)
MCH: 28.3 pg (ref 26.0–34.0)
MCHC: 31.4 g/dL (ref 30.0–36.0)

## 2010-11-21 LAB — DIFFERENTIAL
Basophils Relative: 0 % (ref 0–1)
Eosinophils Absolute: 0.5 10*3/uL (ref 0.0–0.7)
Monocytes Absolute: 1.3 10*3/uL — ABNORMAL HIGH (ref 0.1–1.0)
Monocytes Relative: 11 % (ref 3–12)

## 2010-11-21 LAB — BASIC METABOLIC PANEL
CO2: 28 mEq/L (ref 19–32)
Calcium: 9.7 mg/dL (ref 8.4–10.5)
Creatinine, Ser: 0.62 mg/dL (ref 0.4–1.5)
GFR calc Af Amer: >60 mL/min (ref >60)

## 2010-11-21 LAB — CBC
Hemoglobin: 9 g/dL — ABNORMAL LOW (ref 13.0–17.0)
MCH: 28.5 pg (ref 26.0–34.0)
MCHC: 31.5 g/dL (ref 30.0–36.0)

## 2010-11-22 LAB — CBC
MCH: 28.4 pg (ref 26.0–34.0)
MCHC: 31.3 g/dL (ref 30.0–36.0)
Platelets: 606 10*3/uL — ABNORMAL HIGH (ref 150–400)
RBC: 3.27 MIL/uL — ABNORMAL LOW (ref 4.22–5.81)
RDW: 15.4 % (ref 11.5–15.5)

## 2010-11-22 LAB — BASIC METABOLIC PANEL
BUN: 13 mg/dL (ref 6–23)
Calcium: 9.2 mg/dL (ref 8.4–10.5)
Chloride: 97 mEq/L (ref 96–112)
Creatinine, Ser: 0.63 mg/dL (ref 0.4–1.5)
GFR calc Af Amer: >60 mL/min (ref >60)
GFR calc non Af Amer: >60 mL/min (ref >60)

## 2010-11-23 LAB — BASIC METABOLIC PANEL
BUN: 16 mg/dL (ref 6–23)
CO2: 31 mEq/L (ref 19–32)
Calcium: 9.4 mg/dL (ref 8.4–10.5)
Chloride: 99 mEq/L (ref 96–112)
Creatinine, Ser: 0.72 mg/dL (ref 0.4–1.5)
GFR calc Af Amer: >60 mL/min (ref >60)
Glucose, Bld: 88 mg/dL (ref 70–99)

## 2010-11-24 LAB — CULTURE, BLOOD (ROUTINE X 2)
Culture  Setup Time: 201205180854
Culture: NO GROWTH

## 2010-11-24 LAB — CBC
Hemoglobin: 9.3 g/dL — ABNORMAL LOW (ref 13.0–17.0)
MCH: 28.4 pg (ref 26.0–34.0)
MCV: 91.2 fL (ref 78.0–100.0)
Platelets: 707 10*3/uL — ABNORMAL HIGH (ref 150–400)
RBC: 3.28 MIL/uL — ABNORMAL LOW (ref 4.22–5.81)
WBC: 11.3 10*3/uL — ABNORMAL HIGH (ref 4.0–10.5)

## 2010-11-25 LAB — STOOL CULTURE

## 2010-11-30 NOTE — Discharge Summary (Addendum)
NAME:  [name], [name]              ACCOUNT NO.:  [id number]  MEDICAL RECORD NO.:  [id number]           PATIENT TYPE:  I  LOCATION:  1524                         FACILITY:  [facility]  PHYSICIAN:  Hartley Barefoot, MD    DATE OF BIRTH:  [birth year]  DATE OF ADMISSION:  11/17/2010 DATE OF DISCHARGE:  11/25/2010                              DISCHARGE SUMMARY   PRIMARY CARE PHYSICIAN:  None.  DISCHARGE DIAGNOSES: 1. Bilateral lower extremity cellulitis, resolved. 2. Diarrhea, possibly secondary to antibiotics. 3. History of alcohol dependence. 4. Polysubstance abuse - cocaine and tetrahydrocannabinol. 5. Anemia. 6. Leukocytosis. 7. Possible reactive thrombocytosis. 8. History of fall with prior scalp laceration and right radial     fracture. 9. Right radial fracture in a cast. 10.Gait instability and ataxia. 11.Hypokalemia. 12.Urinary retention, resolved.  DISCHARGE MEDICATIONS: 1. Folic acid 1 mg p.o. daily. 2. Thiamine 100 mg p.o. daily.  LABORATORY DATA:  Stool culture yields no Salmonella, Shigella, Campylobacter or yersinia.  Ova and parasites negative.  Clostridium difficile negative.  WBC is 11.3, hemoglobin 9.3, hematocrit 29.9, platelets 707.  Sodium 137, potassium 4.1, chloride 99, CO2 31, BUN 16, creatinine 0.72, glucose 88.  Blood cultures showed no growth after 5 days x2.  Magnesium level 1.7.  TSH 3.45.  Hepatic panel only significant for alkaline phosphatase at 157 and albumin at 3.4. Urinalysis not suggestive of a urinary tract infection.  Urine drug screen was positive for cocaine and tetrahydrocannabinol.  IMAGING: 1. Abdominal x-ray on Nov 20, 2010.  Impression:  Nonobstructive bowel     gas pattern. 2. CT of the head without contrast on Nov 17, 2010.  Impression:     Premature atrophy.  Chronic microvascular ischemic change.  No     visible acute intracranial findings.  CONSULTATIONS:  None.  PROCEDURES:  None.  BRIEF HISTORY:  Mr. Interrante is a  55 year old homeless male with a history of EtOH abuse and cocaine abuse who presented to the [facility] Emergency Room on May 17 after he fell and was unable to get up.  He did present with swelling of his lower extremities as well as scattered abrasions on his nose and his right upper extremity.  He denied any abdominal pain, nausea, vomiting, fever or chills.  He did have pain in his lower extremity and was unable to ambulate.  Workup in the emergency room included a urine drug screen that was positive for the presence of cocaine and marijuana, which he denies.  He also had leukocytosis and a CT of the head was negative.  Social Services was consulted to make arrangements for him to be discharged, but was unable to do so secondary to his inability to ambulate.  [facility] was asked to admit for further evaluation and treatment.  HOSPITAL COURSE BY PROBLEM: 1. Bilateral leg and foot cellulitis, left greater than right.  The     patient was admitted and cultures were sent.  He was empirically     started on IV Ancef.  He received 2 days of Ancef with prompt     improvement of the cellulitis findings.  He was switched  to oral     Keflex.  He got approximately 2 days of the same, but in the     interim he started having profuse diarrhea.  The workup of which     has been negative.  This diarrhea is attributed to his antibiotics     which were stopped on May 21.  The cellulitis seems to have     clinically resolved and can be monitored. 2. Profuse diarrhea, possibly secondary to antibiotics.  His workup     list is negative for Clostridium difficile and ova and parasites.     Antibiotics were discontinued and the diarrhea has resolved. 3. Urinary retention of unclear etiology.  The patient required in-and-     out catheterization times one on May 21.  Since then he has been     urinating and clinically there is no urinary retention.  There is     some concern if the patient has  some benign prostatic hypertrophy     which may require evaluation in the future. 4. Anemia secondary to iron deficiency versus chronic disease.  This     has been stable during his hospitalization. 5. Thrombocytosis of unclear etiology, but may be reactive.  May also     be secondary to his iron-deficiency anemia.  This will require     outpatient Hematology consult. 6. Hypokalemia was repleted and resolved.  Probably secondary to the     diarrhea. 7. Mild hyponatremia, resolved. 8. Alcohol dependence.  The patient did not show any features of     alcohol withdrawal during this admission.  He will remain on     multivitamins. 9. Substance abuse.  The patient denied using cocaine or marijuana and     does not know how they got into his urine.  The patient did receive     cessation counseling. 10.Severe deconditioning and prior history of falls.  PT and OT     evaluated him and indicated that he was not safe to return home or     to live in the community and recommended a skilled nursing     facility.  During the course of his stay, PT continued to work with     him.  The patient demonstrated more of an unwillingness to  participate versus an inability.  As of yesterday, the patient was     ambulating in the hall and using a walker with assistance.  The     patient today is able to ambulate with a walker independently. 11.COPD was stable during this hospitalization. 12.Scalp laceration.  The patient had two staples that were removed.     The laceration remained clean and dry. 13.Right radial fracture in cast.  The patient has an appointment with     Dr. Magnus Ivan in one week.  PHYSICAL EXAMINATION:  VITAL SIGNS:  Temperature 98.4, blood pressure 121/70, heart rate 76, respirations 20, saturations 100% on room air. GENERAL:  The patient is awake, alert, in no acute distress. CARDIOVASCULAR:  Regular rate and rhythm.  No murmur, gallop or rub. RESPIRATORY:  Normal effort.  Breath sounds  clear to auscultation bilaterally.  No rhonchi, wheezes or rales. ABDOMEN:  Flat, soft, positive bowel sounds throughout, nontender to palpation. NEUROLOGIC:  Alert and oriented x3.  Speech clear.  Facial symmetry. MUSCULOSKELETAL:  Right wrist splint dry and intact.  Fingers without erythema or swelling.  FOLLOWUP:  The patient has an appointment with Dr. Magnus Ivan in one week. The patient  is to continue using a walker for ambulation ad lib.  DISPOSITION:  The patient is being discharged to assisted living.  He will get home health PT and OT at this facility.  Time spent 25 minutes.     Gwenyth Bender, NP   ______________________________ Hartley Barefoot, MD    KMB/MEDQ  D:  11/25/2010  T:  11/25/2010  Job:  [id number]  Electronically Signed by Toya Smothers  on 11/30/2010 04:18:46 PM Electronically Signed by Hartley Barefoot MD on 12/01/2010 09:57:35 PM MedRecNo: [id number] [facility], Account: [id number], DocSeq: [id number] ALF will provied assistance with ambulation, bathing and PT. Electronically Signed by Hartley Barefoot MD on 12/01/2010 09:57:31 PM

## 2010-11-30 NOTE — H&P (Signed)
  NAME:  [name], [name] NO.:  [id number]  MEDICAL RECORD NO.:  [id number]           PATIENT TYPE:  E  LOCATION:  WLED                         FACILITY:  [facility]  PHYSICIAN:  Houston Siren, MD           DATE OF BIRTH:  [birth year]  DATE OF ADMISSION:  11/17/2010 DATE OF DISCHARGE:                             HISTORY & PHYSICAL   PRIMARY CARE PHYSICIAN:  None.  ADVANCE DIRECTIVE:  Full code.  REASON FOR ADMISSION:  Lower extremity cellulitis.  HISTORY OF PRESENT ILLNESS:  This is a 55 year old homeless male with history of alcohol and cocaine abuse presented to [facility] Emergency Room after he fell today and unable to get up.  He does have swelling of his lower extremities and has scattered abrasions on his nose and his right upper extremity.  He denied any abdominal pain, nausea, vomiting, fever or chills.  He does have pain of his lower extremities and unable to ambulate.  Evaluation in the emergency room included a UDS positive for the presence of cocaine and marijuana (he denied vehemently ever using them). He offered no explanation when I told him his UDS was positive. He has a leukocytosis with [place] of 16[address], hemoglobin of 10.0.  His urinalysis is negative.  Chest x-ray is clear.  He does have potassium of 3.6 and creatinine of 0.83.  His head CT is negative.  Social Service was consulted to make arrangement for him to be discharged but unable to do so tonight.  He was thus admitted for cellulitis, unable to ambulate.  PAST MEDICAL HISTORY:  Benign polysubstance abuse.  FAMILY HISTORY:  Noncontributory.  MEDICATIONS:  On no chronic meds.  ALLERGIES:  No known drug allergies.  PHYSICAL EXAMINATION:  VITAL SIGNS:  Physical exam showed blood pressure 140/70, pulse of 85, respiratory rate 18, temperature 98.4. GENERAL:  Exam shows he is alert and oriented and conversing. HEENT:  He has facial symmetry and fluent speech.  Tongue is midline. He has  abrasion on his nose and small abrasion on his upper right arm. HEART:  S1, S2.  Regular. LUNGS:  Clear. ABDOMEN:  Abdominal exam is soft, nondistended, nontender. EXTREMITIES:  Showed bilateral leg swelling with erythema consistent with cellulitis.  Overall he is quite unkempt. Neurological exam and psychiatric exams unremarkable.  OBJECTIVE FINDINGS:  As above.  IMPRESSION:  This is a 55 year old homeless male with bilateral lower extremity cellulitis and polysubstance abuse.  We will give him intravenous fluids and replace his potassium.  I would like to give him Ancef 1.5 g IV q.8 h for his lower extremity cellulitis.  I do not think that he needs vancomycin.  He is a full code.  Will be admitted to [facility].  Social service will follow up for placement as he cannot be discharged to the street.     Houston Siren, MD     PL/MEDQ  D:  11/17/2010  T:  11/17/2010  Job:  [id number]  Electronically Signed by Houston Siren  on 11/30/2010 02:47:51 AM

## 2010-12-25 NOTE — Group Therapy Note (Signed)
NAME:  [name], [name]              ACCOUNT NO.:  [id number]  MEDICAL RECORD NO.:  [id number]           PATIENT TYPE:  I  LOCATION:  1524                         FACILITY:  [facility]  PHYSICIAN:  Marcellus Scott, MD     DATE OF BIRTH:  1955/10/16                                PROGRESS NOTE   PRIMARY CARE PHYSICIAN: None.  CURRENT DIAGNOSES: 1. Bilateral lower extremity cellulitis, resolved. 2. Diarrhea, possibly secondary to antibiotics, improving. 3. History of alcohol dependence. 4. Polysubstance abuse - Cocaine and tetrahydrocannabinol. 5. Anemia, stable. 6. Leukocytosis. 7. Possible reactive thrombocytosis. 8. History of fall with prior scalp laceration and right radial     fracture. 9. Right radial fracture, in cast. 10.Gait instability and ataxia. 11.Hypokalemia. 12.Urinary retention, resolved.  DISCHARGE MEDICATIONS: To be dictated by discharging MD.  IMAGING: 1. Abdominal x-ray, Nov 20, 2010.  Impression, nonobstructive bowel     gas pattern. 2. CT of the head without contrast on Nov 17, 2010.  Impression,     premature atrophy.  Chronic microvascular ischemic change.  No     visible acute intracranial findings.  LABORATORY DATA: Stool cultures pending.  Basic metabolic panel today significant for sodium 134 and glucose 108.  Otherwise within normal limits.  CBC, hemoglobin 9.3, hematocrit 29.7, white blood cell count 11.2, and platelets 606.  Magnesium is 1.7.  Clostridium difficile PCR negative. Blood cultures x2 are no growth to date.  TSH is 3.458.  Blood alcohol level less than 11.  Hepatic panel only significant for alkaline phosphatase 157 and albumin of 3.4.  Urinalysis, not suggestive of urinary tract infection.  Urine drug screen, positive for cocaine and tetrahydrocannabinol.  CONSULTATIONS: None.  TODAY'S COMPLAINTS: The patient denies complaints.  He denies abdominal pain.  He indicates his diarrhea is better.  According to nurses, the patient had  smears of stool x4 overnight and 1 small stool since this morning.  This is significantly better compared to yesterday.  PHYSICAL EXAMINATION: GENERAL:  [name] is in no obvious distress. VITAL SIGNS:  Temperature 98.3 degrees Fahrenheit, pulse 89 per minute and regular, respirations 20 per minute, blood pressure 130/77 mmHg and saturating at 100% on room air. RESPIRATORY SYSTEM:  Clear. CARDIOVASCULAR SYSTEM:  First and second heart sounds regular.  No murmur or JVD. ABDOMEN:  Nondistended, nontender, and soft.  No organomegaly or mass appreciated. CENTRAL NERVOUS SYSTEM:  The patient is awake, alert, and oriented x3 with no focal neurological deficits. EXTREMITIES:  With resolved redness, swelling, warmth, and tenderness of his legs.  HOSPITAL COURSE: [name] is a 55 year old male patient with history of alcohol dependence, substance abuse, recurrent falls with history of recent scalp laceration on May 14th, which required 2 staples, right radial fracture requiring cast, who was recently discharged from the hospital on May 6th.  He at that time, declined skilled nursing facility placement or admission to shelter.  He also has history of COPD, tobacco dependence.  He now returned with history of swelling of his lower extremities with redness and pain on ambulation.  White cell count was 13,600.  1. Bilateral leg and foot cellulitis, left  greater than the right.     The patient was admitted to the hospital.  Cultures were sent off.     He was empirically started on IV Ancef.  He received 2 days of     Ancef with prompt improvement of his cellulitis findings.  He was     switched to oral Keflex.  He got approximately 2 days of the same,     but in the interim, he started having profuse diarrhea, the workup     of which is thus far negative.  This diarrhea is attributed to his     antibiotics, which were stopped yesterday.  The cellulitis seems to     have clinically  resolved and can be monitored. 2. Profuse diarrhea, possibly secondary to antibiotics.  His workup     thus far is negative and his antibiotics are held and the diarrhea     seems to be improving. 3. Urinary retention, unclear etiology, but the patient required in     and out cath x1 and since then he has been urinating and     clinically, there does not seem to be urinary retention.  I wonder     if the patient has some benign prostatic hypertrophy, which may     require evaluation in the future. 4. Anemia secondary to iron deficiency versus chronic disease.  This     has been stable. 5. Thrombocytosis, unclear etiology, but may be reactive     thrombocytosis.  This may be secondary to iron deficiency anemia to     this, will require an outpatient hematology consultation. 6. Hypokalemia, repleted.  This is secondary to the diarrhea. 7. Mild hyponatremia. 8. Alcohol dependence.  The patient did not show any features of     alcohol withdrawal on this admission.  He is on multivitamins. 9. Substance abuse.  The patient denied using cocaine or marijuana and     does not know how they got in his urine stream.  Cessation     counseling done.10.Severe deconditioning and prior history of falls.  PT/OT evaluated     him and indicated that he is not safe to return home or to live in     the community and recommend skilled nursing facility placement.     The patient initially declined this, but since then has agreed to a     skilled nursing facility placement and the search for this is     ongoing. 11.COPD, stable. 12.Scalp laceration, status post staples.  These staples are to be     removed today. 13.Right radial fracture cast.  This can be followed up as an     outpatient.  He apparently has a followup appointment sometime in     June of this year.     Marcellus Scott, MD     AH/MEDQ  D:  11/22/2010  T:  11/22/2010  Job:  [id number]  Electronically Signed by Marcellus Scott MD on  12/25/2010 01:53:33 AM

## 2010-12-27 ENCOUNTER — Emergency Department (HOSPITAL_COMMUNITY)
Admission: EM | Admit: 2010-12-27 | Discharge: 2010-12-27 | Disposition: A | Discharge status: Home / Self Care | Payer: Self-pay | Attending: Emergency Medicine | Admitting: Emergency Medicine

## 2010-12-27 DIAGNOSIS — Z5900 Homelessness unspecified: Secondary | ICD-10-CM | POA: Insufficient documentation

## 2010-12-27 DIAGNOSIS — M25539 Pain in unspecified wrist: Secondary | ICD-10-CM | POA: Insufficient documentation

## 2011-01-01 ENCOUNTER — Emergency Department (HOSPITAL_COMMUNITY): Payer: Self-pay

## 2011-01-01 ENCOUNTER — Inpatient Hospital Stay (HOSPITAL_COMMUNITY)
Admission: EM | Admit: 2011-01-01 | Discharge: 2011-01-04 | DRG: 684 | Disposition: A | Discharge status: Home / Self Care | Payer: Self-pay | Attending: Family Medicine | Admitting: Family Medicine

## 2011-01-01 DIAGNOSIS — D72829 Elevated white blood cell count, unspecified: Secondary | ICD-10-CM | POA: Diagnosis present

## 2011-01-01 DIAGNOSIS — D509 Iron deficiency anemia, unspecified: Secondary | ICD-10-CM | POA: Diagnosis present

## 2011-01-01 DIAGNOSIS — E860 Dehydration: Secondary | ICD-10-CM | POA: Diagnosis present

## 2011-01-01 DIAGNOSIS — F1010 Alcohol abuse, uncomplicated: Secondary | ICD-10-CM

## 2011-01-01 DIAGNOSIS — J449 Chronic obstructive pulmonary disease, unspecified: Secondary | ICD-10-CM | POA: Diagnosis present

## 2011-01-01 DIAGNOSIS — Z4789 Encounter for other orthopedic aftercare: Secondary | ICD-10-CM

## 2011-01-01 DIAGNOSIS — R339 Retention of urine, unspecified: Secondary | ICD-10-CM | POA: Diagnosis present

## 2011-01-01 DIAGNOSIS — F17200 Nicotine dependence, unspecified, uncomplicated: Secondary | ICD-10-CM | POA: Diagnosis present

## 2011-01-01 DIAGNOSIS — B353 Tinea pedis: Secondary | ICD-10-CM | POA: Diagnosis present

## 2011-01-01 DIAGNOSIS — N179 Acute kidney failure, unspecified: Principal | ICD-10-CM | POA: Diagnosis present

## 2011-01-01 DIAGNOSIS — J4489 Other specified chronic obstructive pulmonary disease: Secondary | ICD-10-CM | POA: Diagnosis present

## 2011-01-01 DIAGNOSIS — E876 Hypokalemia: Secondary | ICD-10-CM | POA: Diagnosis present

## 2011-01-01 DIAGNOSIS — N139 Obstructive and reflux uropathy, unspecified: Secondary | ICD-10-CM

## 2011-01-01 LAB — BASIC METABOLIC PANEL
BUN: 46 mg/dL — ABNORMAL HIGH (ref 6–23)
Chloride: 103 mEq/L (ref 96–112)
Creatinine, Ser: 2.92 mg/dL — ABNORMAL HIGH (ref 0.50–1.35)
Glucose, Bld: 121 mg/dL — ABNORMAL HIGH (ref 70–99)
Potassium: 3.4 mEq/L — ABNORMAL LOW (ref 3.5–5.1)

## 2011-01-01 LAB — CBC
HCT: 32.8 % — ABNORMAL LOW (ref 39.0–52.0)
MCH: 29.3 pg (ref 26.0–34.0)
MCV: 88.9 fL (ref 78.0–100.0)
Platelets: 583 10*3/uL — ABNORMAL HIGH (ref 150–400)
RBC: 3.69 MIL/uL — ABNORMAL LOW (ref 4.22–5.81)
RDW: 15.4 % (ref 11.5–15.5)
WBC: 11.8 10*3/uL — ABNORMAL HIGH (ref 4.0–10.5)

## 2011-01-01 LAB — DIFFERENTIAL
Eosinophils Absolute: 0 10*3/uL (ref 0.0–0.7)
Eosinophils Relative: 0 % (ref 0–5)
Lymphocytes Relative: 7 % — ABNORMAL LOW (ref 12–46)
Lymphs Abs: 0.8 10*3/uL (ref 0.7–4.0)
Monocytes Relative: 2 % — ABNORMAL LOW (ref 3–12)

## 2011-01-02 ENCOUNTER — Inpatient Hospital Stay (HOSPITAL_COMMUNITY): Payer: Self-pay

## 2011-01-02 LAB — URINALYSIS, ROUTINE W REFLEX MICROSCOPIC
Bilirubin Urine: NEGATIVE
Hgb urine dipstick: NEGATIVE
Ketones, ur: NEGATIVE mg/dL
Specific Gravity, Urine: 1.015 (ref 1.005–1.030)
pH: 5.5 (ref 5.0–8.0)

## 2011-01-02 LAB — GLUCOSE, CAPILLARY: Glucose-Capillary: 123 mg/dL — ABNORMAL HIGH (ref 70–99)

## 2011-01-02 LAB — DRUGS OF ABUSE SCREEN W/O ALC, ROUTINE URINE
Barbiturate Quant, Ur: NEGATIVE
Benzodiazepines.: NEGATIVE
Cocaine Metabolites: NEGATIVE
Methadone: NEGATIVE

## 2011-01-02 LAB — BASIC METABOLIC PANEL
BUN: 45 mg/dL — ABNORMAL HIGH (ref 6–23)
CO2: 22 mEq/L (ref 19–32)
Calcium: 8.1 mg/dL — ABNORMAL LOW (ref 8.4–10.5)
Creatinine, Ser: 1.95 mg/dL — ABNORMAL HIGH (ref 0.50–1.35)
Glucose, Bld: 123 mg/dL — ABNORMAL HIGH (ref 70–99)

## 2011-01-02 LAB — CBC
HCT: 26.9 % — ABNORMAL LOW (ref 39.0–52.0)
Hemoglobin: 8.8 g/dL — ABNORMAL LOW (ref 13.0–17.0)
MCH: 29.1 pg (ref 26.0–34.0)
MCHC: 32.7 g/dL (ref 30.0–36.0)
MCV: 89.1 fL (ref 78.0–100.0)
RBC: 3.02 MIL/uL — ABNORMAL LOW (ref 4.22–5.81)

## 2011-01-02 LAB — AMMONIA: Ammonia: 38 umol/L (ref 11–60)

## 2011-01-03 ENCOUNTER — Inpatient Hospital Stay (HOSPITAL_COMMUNITY): Payer: Self-pay

## 2011-01-03 LAB — COMPREHENSIVE METABOLIC PANEL
AST: 18 U/L (ref 0–37)
Albumin: 2.4 g/dL — ABNORMAL LOW (ref 3.5–5.2)
Alkaline Phosphatase: 66 U/L (ref 39–117)
BUN: 21 mg/dL (ref 6–23)
Chloride: 107 mEq/L (ref 96–112)
Potassium: 2.4 mEq/L — CL (ref 3.5–5.1)
Total Bilirubin: 0.1 mg/dL — ABNORMAL LOW (ref 0.3–1.2)

## 2011-01-03 LAB — URINE CULTURE
Colony Count: NO GROWTH
Culture  Setup Time: 201207020334

## 2011-01-03 LAB — CBC
HCT: 26.5 % — ABNORMAL LOW (ref 39.0–52.0)
Platelets: 474 10*3/uL — ABNORMAL HIGH (ref 150–400)
RBC: 2.95 MIL/uL — ABNORMAL LOW (ref 4.22–5.81)
RDW: 15.2 % (ref 11.5–15.5)
WBC: 11.1 10*3/uL — ABNORMAL HIGH (ref 4.0–10.5)

## 2011-01-03 LAB — HIV ANTIBODY (ROUTINE TESTING W REFLEX): HIV: NONREACTIVE

## 2011-01-03 LAB — BASIC METABOLIC PANEL
BUN: 17 mg/dL (ref 6–23)
CO2: 25 mEq/L (ref 19–32)
Chloride: 108 mEq/L (ref 96–112)
Creatinine, Ser: 0.91 mg/dL (ref 0.50–1.35)
GFR calc Af Amer: >60 mL/min (ref >60)

## 2011-01-04 LAB — BASIC METABOLIC PANEL
CO2: 21 mEq/L (ref 19–32)
Calcium: 8.7 mg/dL (ref 8.4–10.5)
Creatinine, Ser: 0.86 mg/dL (ref 0.50–1.35)
Glucose, Bld: 94 mg/dL (ref 70–99)

## 2011-01-05 ENCOUNTER — Emergency Department (HOSPITAL_COMMUNITY)
Admission: EM | Admit: 2011-01-05 | Discharge: 2011-01-05 | Disposition: A | Discharge status: Home / Self Care | Payer: Self-pay | Attending: Emergency Medicine | Admitting: Emergency Medicine

## 2011-01-05 DIAGNOSIS — R42 Dizziness and giddiness: Secondary | ICD-10-CM | POA: Insufficient documentation

## 2011-01-05 DIAGNOSIS — X30XXXA Exposure to excessive natural heat, initial encounter: Secondary | ICD-10-CM | POA: Insufficient documentation

## 2011-01-05 DIAGNOSIS — E860 Dehydration: Secondary | ICD-10-CM | POA: Insufficient documentation

## 2011-01-05 DIAGNOSIS — T675XXA Heat exhaustion, unspecified, initial encounter: Secondary | ICD-10-CM | POA: Insufficient documentation

## 2011-01-05 DIAGNOSIS — R5381 Other malaise: Secondary | ICD-10-CM | POA: Insufficient documentation

## 2011-01-05 DIAGNOSIS — R5383 Other fatigue: Secondary | ICD-10-CM | POA: Insufficient documentation

## 2011-01-05 LAB — COMPREHENSIVE METABOLIC PANEL
CO2: 27 mEq/L (ref 19–32)
Calcium: 8.8 mg/dL (ref 8.4–10.5)
Creatinine, Ser: 1.44 mg/dL — ABNORMAL HIGH (ref 0.50–1.35)
GFR calc Af Amer: >60 mL/min (ref >60)
GFR calc non Af Amer: 52 mL/min — ABNORMAL LOW (ref >60)
Glucose, Bld: 92 mg/dL (ref 70–99)

## 2011-01-05 LAB — CBC
Hemoglobin: 9 g/dL — ABNORMAL LOW (ref 13.0–17.0)
MCHC: 32.7 g/dL (ref 30.0–36.0)
Platelets: 542 10*3/uL — ABNORMAL HIGH (ref 150–400)
RBC: 3.03 MIL/uL — ABNORMAL LOW (ref 4.22–5.81)

## 2011-01-05 LAB — DIFFERENTIAL
Basophils Relative: 0 % (ref 0–1)
Monocytes Relative: 6 % (ref 3–12)
Neutro Abs: 16.1 10*3/uL — ABNORMAL HIGH (ref 1.7–7.7)
Neutrophils Relative %: 80 % — ABNORMAL HIGH (ref 43–77)

## 2011-01-06 ENCOUNTER — Ambulatory Visit (HOSPITAL_COMMUNITY)
Admission: RE | Admit: 2011-01-06 | Discharge: 2011-01-06 | Disposition: A | Discharge status: Home / Self Care | Payer: Self-pay | Source: Ambulatory Visit | Attending: Emergency Medicine | Admitting: Emergency Medicine

## 2011-01-06 ENCOUNTER — Emergency Department (HOSPITAL_COMMUNITY)
Admission: EM | Admit: 2011-01-06 | Discharge: 2011-01-06 | Disposition: A | Discharge status: Other Acute Inpatient Hospital | Payer: Self-pay | Source: Home / Self Care | Attending: Emergency Medicine | Admitting: Emergency Medicine

## 2011-01-06 DIAGNOSIS — Z8673 Personal history of transient ischemic attack (TIA), and cerebral infarction without residual deficits: Secondary | ICD-10-CM | POA: Insufficient documentation

## 2011-01-06 DIAGNOSIS — R5381 Other malaise: Secondary | ICD-10-CM | POA: Insufficient documentation

## 2011-01-06 DIAGNOSIS — E860 Dehydration: Secondary | ICD-10-CM | POA: Insufficient documentation

## 2011-01-06 DIAGNOSIS — G9389 Other specified disorders of brain: Secondary | ICD-10-CM | POA: Insufficient documentation

## 2011-01-06 DIAGNOSIS — R279 Unspecified lack of coordination: Secondary | ICD-10-CM | POA: Insufficient documentation

## 2011-01-06 DIAGNOSIS — R000 Tachycardia, unspecified: Secondary | ICD-10-CM | POA: Insufficient documentation

## 2011-01-06 DIAGNOSIS — R5383 Other fatigue: Secondary | ICD-10-CM | POA: Insufficient documentation

## 2011-01-06 LAB — ETHANOL: Alcohol, Ethyl (B): <11 mg/dL (ref 0–11)

## 2011-01-06 LAB — COMPREHENSIVE METABOLIC PANEL
Albumin: 3.1 g/dL — ABNORMAL LOW (ref 3.5–5.2)
Alkaline Phosphatase: 93 U/L (ref 39–117)
BUN: 24 mg/dL — ABNORMAL HIGH (ref 6–23)
Creatinine, Ser: 1.24 mg/dL (ref 0.50–1.35)
GFR calc Af Amer: >60 mL/min (ref >60)
Glucose, Bld: 102 mg/dL — ABNORMAL HIGH (ref 70–99)
Total Bilirubin: 0.2 mg/dL — ABNORMAL LOW (ref 0.3–1.2)
Total Protein: 6.9 g/dL (ref 6.0–8.3)

## 2011-01-06 LAB — URINALYSIS, ROUTINE W REFLEX MICROSCOPIC
Hgb urine dipstick: NEGATIVE
Leukocytes, UA: NEGATIVE
Nitrite: NEGATIVE
Protein, ur: NEGATIVE mg/dL
Specific Gravity, Urine: 1.017 (ref 1.005–1.030)
Urobilinogen, UA: 0.2 mg/dL (ref 0.0–1.0)

## 2011-01-06 LAB — AMMONIA: Ammonia: 27 umol/L (ref 11–60)

## 2011-01-06 LAB — DIFFERENTIAL
Eosinophils Relative: 1 % (ref 0–5)
Lymphocytes Relative: 19 % (ref 12–46)
Lymphs Abs: 3.2 10*3/uL (ref 0.7–4.0)
Monocytes Absolute: 1.6 10*3/uL — ABNORMAL HIGH (ref 0.1–1.0)
Monocytes Relative: 10 % (ref 3–12)
Neutro Abs: 11.4 10*3/uL — ABNORMAL HIGH (ref 1.7–7.7)

## 2011-01-06 LAB — CBC
HCT: 26.7 % — ABNORMAL LOW (ref 39.0–52.0)
Hemoglobin: 8.5 g/dL — ABNORMAL LOW (ref 13.0–17.0)
MCHC: 31.8 g/dL (ref 30.0–36.0)
MCV: 92.1 fL (ref 78.0–100.0)
RDW: 16.2 % — ABNORMAL HIGH (ref 11.5–15.5)

## 2011-01-06 LAB — RAPID URINE DRUG SCREEN, HOSP PERFORMED
Cocaine: NOT DETECTED
Opiates: NOT DETECTED

## 2011-01-06 LAB — POCT I-STAT, CHEM 8
Chloride: 106 mEq/L (ref 96–112)
Glucose, Bld: 80 mg/dL (ref 70–99)
HCT: 27 % — ABNORMAL LOW (ref 39.0–52.0)
Potassium: 4.1 mEq/L (ref 3.5–5.1)
Sodium: 140 mEq/L (ref 135–145)

## 2011-01-06 LAB — CK: Total CK: 184 U/L (ref 7–232)

## 2011-01-07 ENCOUNTER — Inpatient Hospital Stay (HOSPITAL_COMMUNITY)
Admission: EM | Admit: 2011-01-07 | Discharge: 2011-01-13 | DRG: 641 | Disposition: A | Discharge status: Home / Self Care | Payer: Self-pay | Source: Ambulatory Visit | Attending: Family Medicine | Admitting: Family Medicine

## 2011-01-07 DIAGNOSIS — I824Y9 Acute embolism and thrombosis of unspecified deep veins of unspecified proximal lower extremity: Secondary | ICD-10-CM | POA: Diagnosis present

## 2011-01-07 DIAGNOSIS — S5290XD Unspecified fracture of unspecified forearm, subsequent encounter for closed fracture with routine healing: Secondary | ICD-10-CM

## 2011-01-07 DIAGNOSIS — F17200 Nicotine dependence, unspecified, uncomplicated: Secondary | ICD-10-CM | POA: Diagnosis present

## 2011-01-07 DIAGNOSIS — E860 Dehydration: Secondary | ICD-10-CM

## 2011-01-07 DIAGNOSIS — I824Z9 Acute embolism and thrombosis of unspecified deep veins of unspecified distal lower extremity: Secondary | ICD-10-CM

## 2011-01-07 DIAGNOSIS — J4489 Other specified chronic obstructive pulmonary disease: Secondary | ICD-10-CM | POA: Diagnosis present

## 2011-01-07 DIAGNOSIS — F1010 Alcohol abuse, uncomplicated: Secondary | ICD-10-CM | POA: Diagnosis present

## 2011-01-07 DIAGNOSIS — D72829 Elevated white blood cell count, unspecified: Secondary | ICD-10-CM | POA: Diagnosis present

## 2011-01-07 DIAGNOSIS — F1210 Cannabis abuse, uncomplicated: Secondary | ICD-10-CM | POA: Diagnosis present

## 2011-01-07 DIAGNOSIS — Z5900 Homelessness unspecified: Secondary | ICD-10-CM

## 2011-01-07 DIAGNOSIS — I82409 Acute embolism and thrombosis of unspecified deep veins of unspecified lower extremity: Secondary | ICD-10-CM

## 2011-01-07 DIAGNOSIS — S72009D Fracture of unspecified part of neck of unspecified femur, subsequent encounter for closed fracture with routine healing: Secondary | ICD-10-CM

## 2011-01-07 DIAGNOSIS — F1410 Cocaine abuse, uncomplicated: Secondary | ICD-10-CM | POA: Diagnosis present

## 2011-01-07 DIAGNOSIS — R339 Retention of urine, unspecified: Secondary | ICD-10-CM | POA: Diagnosis present

## 2011-01-07 DIAGNOSIS — D518 Other vitamin B12 deficiency anemias: Secondary | ICD-10-CM | POA: Diagnosis present

## 2011-01-07 DIAGNOSIS — Z7901 Long term (current) use of anticoagulants: Secondary | ICD-10-CM

## 2011-01-07 DIAGNOSIS — Z79899 Other long term (current) drug therapy: Secondary | ICD-10-CM

## 2011-01-07 DIAGNOSIS — J449 Chronic obstructive pulmonary disease, unspecified: Secondary | ICD-10-CM | POA: Diagnosis present

## 2011-01-07 LAB — LIPID PANEL
Cholesterol: 157 mg/dL (ref 0–200)
HDL: 24 mg/dL — ABNORMAL LOW (ref >39)
Triglycerides: 118 mg/dL (ref <150)

## 2011-01-07 LAB — IRON AND TIBC
Iron: 45 ug/dL (ref 42–135)
TIBC: 200 ug/dL — ABNORMAL LOW (ref 215–435)
UIBC: 155 ug/dL

## 2011-01-07 LAB — BASIC METABOLIC PANEL
Calcium: 7.9 mg/dL — ABNORMAL LOW (ref 8.4–10.5)
GFR calc Af Amer: >60 mL/min (ref >60)
GFR calc non Af Amer: >60 mL/min (ref >60)
Glucose, Bld: 91 mg/dL (ref 70–99)
Potassium: 3.5 mEq/L (ref 3.5–5.1)
Sodium: 141 mEq/L (ref 135–145)

## 2011-01-07 LAB — CBC
Hemoglobin: 7.9 g/dL — ABNORMAL LOW (ref 13.0–17.0)
MCH: 30 pg (ref 26.0–34.0)
MCHC: 33.3 g/dL (ref 30.0–36.0)
Platelets: 501 10*3/uL — ABNORMAL HIGH (ref 150–400)
RBC: 2.63 MIL/uL — ABNORMAL LOW (ref 4.22–5.81)

## 2011-01-07 LAB — VITAMIN B12: Vitamin B-12: 214 pg/mL (ref 211–911)

## 2011-01-08 LAB — PROTIME-INR: INR: 1.06 (ref 0.00–1.49)

## 2011-01-08 LAB — FREE PSA
PSA, Free Pct: 15 % — ABNORMAL LOW (ref >25)
PSA, Free: 0.1 ng/mL

## 2011-01-09 DIAGNOSIS — S86019A Strain of unspecified Achilles tendon, initial encounter: Secondary | ICD-10-CM

## 2011-01-09 DIAGNOSIS — I82409 Acute embolism and thrombosis of unspecified deep veins of unspecified lower extremity: Secondary | ICD-10-CM

## 2011-01-09 HISTORY — DX: Acute embolism and thrombosis of unspecified deep veins of unspecified lower extremity: I82.409

## 2011-01-09 HISTORY — DX: Strain of unspecified achilles tendon, initial encounter: S86.019A

## 2011-01-09 LAB — COMPREHENSIVE METABOLIC PANEL
ALT: 18 U/L (ref 0–53)
AST: 14 U/L (ref 0–37)
Albumin: 2.7 g/dL — ABNORMAL LOW (ref 3.5–5.2)
Alkaline Phosphatase: 84 U/L (ref 39–117)
Calcium: 8.3 mg/dL — ABNORMAL LOW (ref 8.4–10.5)
Glucose, Bld: 84 mg/dL (ref 70–99)
Potassium: 3.1 mEq/L — ABNORMAL LOW (ref 3.5–5.1)
Sodium: 138 mEq/L (ref 135–145)
Total Protein: 6.4 g/dL (ref 6.0–8.3)

## 2011-01-09 LAB — CBC
MCV: 90.8 fL (ref 78.0–100.0)
Platelets: 552 10*3/uL — ABNORMAL HIGH (ref 150–400)
RBC: 2.82 MIL/uL — ABNORMAL LOW (ref 4.22–5.81)
WBC: 14.4 10*3/uL — ABNORMAL HIGH (ref 4.0–10.5)

## 2011-01-09 LAB — PROTIME-INR
INR: 1.2 (ref 0.00–1.49)
Prothrombin Time: 15.5 seconds — ABNORMAL HIGH (ref 11.6–15.2)

## 2011-01-09 LAB — DIFFERENTIAL
Basophils Absolute: 0 10*3/uL (ref 0.0–0.1)
Eosinophils Absolute: 0.4 10*3/uL (ref 0.0–0.7)
Lymphs Abs: 3.1 10*3/uL (ref 0.7–4.0)
Neutrophils Relative %: 65 % (ref 43–77)

## 2011-01-09 LAB — CULTURE, BLOOD (ROUTINE X 2)
Culture  Setup Time: 201207030222
Culture: NO GROWTH

## 2011-01-10 LAB — BASIC METABOLIC PANEL
BUN: 11 mg/dL (ref 6–23)
CO2: 25 mEq/L (ref 19–32)
Chloride: 102 mEq/L (ref 96–112)
Creatinine, Ser: 0.86 mg/dL (ref 0.50–1.35)
GFR calc Af Amer: >60 mL/min (ref >60)
Potassium: 3.4 mEq/L — ABNORMAL LOW (ref 3.5–5.1)

## 2011-01-10 LAB — CBC
HCT: 25.4 % — ABNORMAL LOW (ref 39.0–52.0)
Hemoglobin: 8.3 g/dL — ABNORMAL LOW (ref 13.0–17.0)
MCH: 29.3 pg (ref 26.0–34.0)
MCHC: 32.7 g/dL (ref 30.0–36.0)
RBC: 2.83 MIL/uL — ABNORMAL LOW (ref 4.22–5.81)

## 2011-01-10 LAB — MAGNESIUM: Magnesium: 1.5 mg/dL (ref 1.5–2.5)

## 2011-01-11 LAB — BASIC METABOLIC PANEL WITH GFR
BUN: 15 mg/dL (ref 6–23)
CO2: 22 meq/L (ref 19–32)
Calcium: 8.6 mg/dL (ref 8.4–10.5)
Chloride: 101 meq/L (ref 96–112)
Creatinine, Ser: 0.95 mg/dL (ref 0.50–1.35)
GFR calc Af Amer: >60 mL/min
GFR calc non Af Amer: >60 mL/min
Glucose, Bld: 82 mg/dL (ref 70–99)
Potassium: 3.9 meq/L (ref 3.5–5.1)
Sodium: 137 meq/L (ref 135–145)

## 2011-01-11 LAB — CBC
HCT: 27 % — ABNORMAL LOW (ref 39.0–52.0)
Hemoglobin: 8.6 g/dL — ABNORMAL LOW (ref 13.0–17.0)
MCH: 29.1 pg (ref 26.0–34.0)
MCHC: 31.9 g/dL (ref 30.0–36.0)
MCV: 91.2 fL (ref 78.0–100.0)
Platelets: 601 K/uL — ABNORMAL HIGH (ref 150–400)
RBC: 2.96 MIL/uL — ABNORMAL LOW (ref 4.22–5.81)
RDW: 16.2 % — ABNORMAL HIGH (ref 11.5–15.5)
WBC: 12.5 K/uL — ABNORMAL HIGH (ref 4.0–10.5)

## 2011-01-11 LAB — PROTIME-INR
INR: 2.58 — ABNORMAL HIGH (ref 0.00–1.49)
Prothrombin Time: 28.1 s — ABNORMAL HIGH (ref 11.6–15.2)

## 2011-01-11 NOTE — Discharge Summary (Signed)
NAMEMarland Kitchen  [name], [name] NO.:  [id number]  MEDICAL RECORD NO.:  [id number]  LOCATION:  3737                         FACILITY:  [facility]  PHYSICIAN:  Paula Compton, MD        DATE OF BIRTH:  19-Apr-1956  DATE OF ADMISSION:  01/01/2011 DATE OF DISCHARGE:  01/04/2011                              DISCHARGE SUMMARY   PRIMARY CARE PHYSICIAN:  None.  The patient is to call [facility] for followup appointment.  CONSULTANTS:  Burnard Bunting, MD, of Orthopedics.  PRIMARY DISCHARGE DIAGNOSIS:  Acute renal failure secondary to dehydration.  SECONDARY DISCHARGE DIAGNOSES: 1. Right radial fracture status post casting in May 2012 status post     casting in May 2012. 2. Chronic obstructive pulmonary disease. 3. Iron-deficiency anemia. 4. History of polysubstance abuse including cocaine and marijuana. 5. Ethanol abuse. 6. History of hip fracture status post open reduction and internal     fixation in February 2012.  DISCHARGE MEDICATIONS:  New Medications: 1. Multivitamin 1 capsule by mouth daily. 2. Thiamine 100 mg by mouth daily. 3. Combivent inhaler 1 puff inhaled q.4 h. for shortness of breath.  HOSPITAL COURSE:  [name] is a 55 year old homeless gentleman with a history of ethanol and polysubstance abuse who presented with dehydration and acute renal failure.  The patient has been over 36 hours without any food or water and remained in the heat.  Police were called for indecent exposure and on arrival, the patient was found to be weak, disoriented, and overheated.  He was taken to the ED and admitted to the [facility] Medicine Service. 1. Acute renal failure secondary to dehydration.  The patient's     creatinine trended down gentle hydration from 2.92 to 0.86 at     discharge.  Given quick trending down of creatinine and urinary     retention, there may have been some element of postrenal renal     failure in addition to prerenal renal  failure. 2. Urinary retention.  The patient did not report any suprapubic     tenderness but on two separate occasions had postvoid bladder scans     with greater than 700 mL with in-and-out catheter producing greater     than 900 mL through the evening of January 03, 2011.  The patient's     urine output slowly increased over the course of 2 days without     catheter usage up to that time.  After the in-and-out cath on January 03, 2011, the patient produced 600 mL of urine without Foley.  The     patient had initially been placed on Flomax but this was     discontinued at discharge due to the fact that they did not have     sufficient time for effect and the patient's urine output had     improved.  Renal ultrasound showed bladder wall thickening with     question of cystitis versus bladder outlet obstruction, but the     patient had negative urinalysis.  The patient will potentially need     outpatient Urology followup. 3. Ethanol abuse.  The patient had  alcohol level less than 11 mg/dL.     He was placed on low CIWA protocol but did not require any     benzodiazepines. 4. Hypokalemia.  The patient with hypokalemia of 2.4 on second day of     admission.  He was asymptomatic but was placed on telemetry.  His     potassium was repleted and was 3.5 at discharge. 5. Hypomagnesemia.  The patient with phosphorus of 1.3 on January 03, 2011, repleted with 2 g IV and discharged with phosphorus level of     1.5. 6. Iron-deficiency anemia.  Needs outpatient colonoscopy. 7. Leukocytosis.  The patient with white count of over 13 on     admission.  The patient had chest x-ray without any acute     cardiopulmonary findings.  Blood cultures negative x2, urinalysis     was unremarkable, urine culture negative, HIV negative. 8. Tinea pedis.  Started on Lotrimin cream, not continued on     discharge. 9. History of polysubstance abuse.  UDS negative. 10.History of right radial fracture, casted in May 2012  - Ortho     consulted and they removed the cast.  They also ordered for     occupational therapy for range of motion therapy.  PROCEDURES: 1. Renal ultrasound on January 03, 2011, bladder showed bladder wall     thickening, question of cystitis versus some degree of bladder     outlet obstruction.  Recommend clinical correlation.  Kidneys have     an unremarkable appearance. 2. Chest x-ray on January 02, 2011.  There are stable changes of     chronic lung disease without interstitial attenuation with scarring     within the left base.  No acute cardiopulmonary disease.  LABORATORY DATA AT DISCHARGE:  BMP within normal limits with creatinine of 0.86. Phosphorus 1.0 before repletion, magnesium sulfate 1.5 at discharge.  White count of 11.1, hemoglobin 8.7, platelets 474.  Hemoglobin A1c 5.7.  HIV negative. Ammonia of 38.  Urine Drug Screen negative.  CONDITION AT TIME OF DISCHARGE:  Stable and able to resume normal activity.  The patient is instructed to take in a lot of fluids and to eat.  DISPOSITION:  Homeless shelter.  DISCHARGE FOLLOWUP:  The patient is to call [facility] for a new patient visit due to discharge on holiday.  FOLLOWUP ISSUES: 1. BMET to monitor creatinine given possible concern for urinary     retention. 2. Recheck mag and phos. 3. Consider postvoid bladder scan. 4. Counsel the patient on tobacco abuse.    ______________________________ Tana Conch, MD   ______________________________ Paula Compton, MD   SH/MEDQ  D:  01/05/2011  T:  01/06/2011  Job:  [id number]  Electronically Signed by Tana Conch MD on 01/06/2011 07:49:27 PM Electronically Signed by Paula Compton MD on 01/11/2011 11:53:38 AM

## 2011-01-12 LAB — PROTIME-INR
INR: 3.05 — ABNORMAL HIGH (ref 0.00–1.49)
Prothrombin Time: 32 seconds — ABNORMAL HIGH (ref 11.6–15.2)

## 2011-01-12 LAB — CBC
HCT: 29.2 % — ABNORMAL LOW (ref 39.0–52.0)
Hemoglobin: 9.4 g/dL — ABNORMAL LOW (ref 13.0–17.0)
RBC: 3.2 MIL/uL — ABNORMAL LOW (ref 4.22–5.81)

## 2011-01-13 LAB — PROTIME-INR
INR: 2.63 — ABNORMAL HIGH (ref 0.00–1.49)
Prothrombin Time: 28.5 seconds — ABNORMAL HIGH (ref 11.6–15.2)

## 2011-01-13 NOTE — Consult Note (Signed)
  NAME:  Kevin, Joyce NO.:  [id number]  MEDICAL RECORD NO.:  [id number]  LOCATION:                                 FACILITY:  PHYSICIAN:  Burnard Bunting, M.D.    DATE OF BIRTH:  June 30, 1956  DATE OF CONSULTATION: DATE OF DISCHARGE:                                CONSULTATION   Consult requested by Dr. Zachery Dauer for evaluation of right wrist pain.  HISTORY OF PRESENT ILLNESS:  [name] is a 55 year old homeless patient, right-hand dominant, who sustained a fracture of his right wrist about 2 months ago.  He was placed into a cast.  He is admitted now currently for dehydration and weakness.  He did not really had any follow-up orthopedic care regarding his right wrist.  He denies any complaints associated with the cast or the fingers.  PAST MEDICAL HISTORY:  Notable for right radial fracture, status post casting in May 2012, did do a search in the hospital record, very unclear who the requesting physician was for the cast.  Does have history of ethanol dependence, history of polysubstance abuse, cocaine, and THC, previous admission with cellulitis of lower extremity, right hip fracture status post open reduction and internal fixation February 2012 by Dr. Magnus Ivan, history of COPD, iron-deficiency anemia.  FAMILY HISTORY:  No significant family history.  SOCIAL HISTORY:  The patient is homeless, smokes about one pack per day. Based upon availability, does report ethanol, marijuana, and cocaine use in the past.  Denies any IV drug use.  ALLERGIES:  No known drug allergies.  MEDICATIONS:  Currently, no medications.  PHYSICAL EXAMINATION:  GENERAL:  He is a well-nourished, well-developed, in no acute distress, alert and oriented.  He is lying in bed. VITAL SIGNS:  Pulse is about 80, blood pressure 115/65, respirations 18, temperature is 98. EXTREMITIES:  He does have a tanned line on the right dorsal hand. Elbow range of motion on the right is intact.   He has 5/5 grip, EPL, FPL,  interosseous with flexion/extension strength.  He had about 60 degrees of wrist extension and about 30 of wrist flexion.  No real pain in pronation and supination.  Pronation and supination is full.  No tenderness to palpation around the distal radius.  All flexor and extensor tendons are palpable, intact, and functioning.  Radiographs in the cast do show healed fracture.  IMPRESSION:  Nontender healed fracture, but with predictably stiff wrist.  PLAN:  Activity is tolerated with OT consult for home exercise program for wrist range of motion exercises.  I am going to proceed with activity as tolerated.  Does not need any type of further splinting.  I will see him back as needed.     Burnard Bunting, M.D.     GSD/MEDQ  D:  01/02/2011  T:  01/03/2011  Job:  [id number]  Electronically Signed by Reece Agar.  DEAN M.D. on 01/13/2011 05:40:22 PM

## 2011-01-15 NOTE — H&P (Signed)
NAMEMarland Kitchen  Kevin, Kevin NO.:  [id number]  MEDICAL RECORD NO.:  [id number]  LOCATION:  3020                         FACILITY:  [facility]  PHYSICIAN:  Chamika Cunanan A. Sheffield Slider, M.D.    DATE OF BIRTH:  [birth year]  DATE OF ADMISSION:  01/01/2011 DATE OF DISCHARGE:                             HISTORY & PHYSICAL   PRIMARY CARE PROVIDER:  None.  CHIEF COMPLAINT:  Weakness.  HISTORY OF PRESENT ILLNESS:  [name] is a 55 year old homeless male who presents to the ED today for weakness and dehydration.  The patient was initially noticed due to a police call for indecent exposure.  Upon place arrival, the patient was noted to be weak, disoriented, and overheated, so EMS was called.  The patient was taken to the emergency department for further evaluation.  The patient reports that he is homeless, he has not had anything to eat or drink in over 36 hours, and that he was out in the sun and heat all day.  He denies any current chest pain, shortness of breath, dyspnea on exertion, headache, changes in vision, or burning with urination. Reports last alcohol consumption was approximately 2 weeks ago.  Denies any recent illicit substance use.  REVIEW OF SYSTEMS:  Performed and was otherwise unremarkable except as stated above.  PAST MEDICAL HISTORY: 1. Right radial fracture status post casting at the end of May 2012. 2. History of ethanol dependence. 3. History of polysubstance abuse (cocaine and THC). 4. Previous admission with cellulitis to the lower extremity in May     2012. 5. Right hip fracture status post open reduction and internal fixation     February 2012. 6. Possible history of COPD. 7. History of iron-deficiency anemia.  FAMILY HISTORY:  Denies any significant family history.  SOCIAL HISTORY:  Homeless, smokes approximately 1 pack per day but based upon availability, ethanol, THC, and cocaine use in the past.  Denies any IV drug use.  ALLERGIES:  No known drug  allergies.  MEDICATIONS:  None per patient.  PHYSICAL EXAMINATION:  VITAL SIGNS:  Pulse 91, blood pressure 115/65, respirations 20, temperature is 99.8 degrees Fahrenheit. GENERAL:  No acute distress, cooperative but unkempt. HEENT:  Dry mucous membranes, trachea midline, neck supple, extraocular movements intact. CARDIOVASCULAR:  Regular rate and rhythm, 2/6 flow murmur heard best at the left lower sternal border, hyperdynamic PMI. RESPIRATORY:  Clear to auscultation bilaterally. ABDOMEN:  Soft, nontender, nondistended with no masses palpable. EXTREMITIES:  No edema, 2+ pulses. NEURO:  Alert and oriented x3, no focal deficits.  LABS AND IMAGING:  BMET shows sodium 142, potassium 3.4, chloride 103, bicarb 21, BUN 46, creatinine 2.92, glucose 121.  CBC shows white count 11.8, hemoglobin 10.8, platelets 583,000.  Blood alcohol was less than 11, UDS is pending as the patient is not currently making any urine. Right wrist x-ray shows healing fracture of the distal radius status post casting  ASSESSMENT AND PLAN:1. Dehydration:  We will continue the patient on 4 liters of normal     saline as a bolus and then run at 150 mL/hour overnight.  We will     obtain a BMET following boluses to check on sodium and  potassium.     We will also obtain a UA when the patient begins making urine. 2. Acute renal failure:  We will hydrate the patient and follow serum     creatinine.  Hopefully the patient's acute renal failure resolved     with rehydration.  We will recheck BMET in the morning, and if the     patient continues to demonstrate renal failure, we will consider a     renal consult. 3. History of substance use:  We will obtain a UDS when the patient     does void and we will place the patient on CIWA as a precaution.     We will also get a 14 mcg nicotine patch. 4. Social:  The patient is amenable to talking with social worker     about housing.  We will order a social work consult for  this. 5. Fluids, electrolytes, gastrointestinal:  P.o. ad lib, 4 liters     bolus, then normal saline 150 mL an hour. 6. Prophylaxis:  Subcu heparin. 7. Code status:  Full code. 8. Disposition:  Pending improvement and resolution of the patient's     acute issues.    ______________________________ Majel Homer, MD   ______________________________ Arnette Norris. Sheffield Slider, M.D.    ER/MEDQ  D:  01/01/2011  T:  01/02/2011  Job:  [id number]  Electronically Signed by Kevin Neptune MD on 01/11/2011 12:52:33 PM Electronically Signed by Zachery Dauer M.D. on 01/15/2011 09:40:45 PM

## 2011-01-16 ENCOUNTER — Ambulatory Visit: Payer: Self-pay

## 2011-01-16 NOTE — H&P (Signed)
[name] Kitchen  [name] NO.:  [id number]  MEDICAL RECORD NO.:  [id number]  LOCATION:  5504                         FACILITY:  [facility]  PHYSICIAN:  Paula Compton, MD        DATE OF BIRTH:  [birth year]  DATE OF ADMISSION:  01/07/2011 DATE OF DISCHARGE:                             HISTORY & PHYSICAL   PRIMARY CARE PROVIDER:  None.  CHIEF COMPLAINT:  Dehydration and ataxia.  HISTORY OF PRESENT ILLNESS:  [name] is a 55 year old homeless male who presented to the emergency department today for concerns about dehydration.  He was once more out in the sun all day with only 2 bottles of water and began to notice some headaches, unsteadiness, and weakness.  Accordingly, he presented to the emergency department.  His work was relatively unremarkable as detailed below. Most notably, his creatinine was 1.24, his specific gravity was 1.017, and his white count was 16.5.  His UDS was negative and a blood alcohol was undetectable.  He was felt ready for discharge but, upon trying to walk, he was found to have ataxia and leaned to the right per the emergency room doctor.  Accordingly, he was not felt safe for discharge. As he had been recently discharged from the [facility], he was transferred to [facility] for further workup of his new-onset ataxia.  Head CT obtained at [facility] was negative for any acute intracranial process.  On interview, the patient reports that he was in the process of applying for a new driver's license as his was lost during his previous admission.  He started feeling weak and decided to call for an ambulance.  He remains homeless at this time.  He reports no shortness of breath, dyspnea on exertion, chest pain, dizziness, presyncope, nausea, vomiting, dysuria, fevers, or chills.  He does report a single episode of diarrhea in the emergency department which, according to the patient, is  the reason for his admission.  He denies any recent changes in coordination, gait, vision, or speech.  Review of systems was performed and was, otherwise, unremarkable except for as detailed above.  PAST MEDICAL HISTORY: 1. Right radial fracture, status post casting at the end of May 2012. 2. History of ethanol dependence. 3. History of substance abuse. 4. Previous admission for cellulitis of the lower extremities. 5. Right hip fracture, status post ORIF in February 2012. 6. History of COPD. 7. History of iron-deficiency anemia. 8. History of urinary retention.  FAMILY HISTORY:  He denies any significant family history.  SOCIAL HISTORY:  Homeless.  Smokes approximately 1-pack per day, but it varies based upon availability.  Ethanol, THC, cocaine use in the past, but denies any currently.  ALLERGIES:  No known drug allergies.  MEDICATIONS:  The patient was discharged on the following medications, but he is not taking them. 1. Multivitamin. 2. Thiamine 100 mg p.o. daily. 3. Combivent 1 puff every 4 hours as needed.  PHYSICAL EXAMINATION:  VITAL SIGNS:  Pulse 88, blood pressure 136/70, respirations 20, temperature is 97.9 degrees Fahrenheit, O2 sat is 100% on room air. GENERAL:  No acute distress, appropriate, and tired. HEENT:  Slight hesitant on full rightward gaze, but I am not sure how much it is related to, the patient just wanting to be left alone to sleep.  No nystagmus, full left gaze, full vertical range of motion of gaze.  Pupils are equal, round, and reactive.  Trachea is midline. Mucous membranes are moist.  There is no adenopathy. CARDIOVASCULAR:  Regular rate and rhythm.  No murmurs.  PMI is still slightly hyperdynamic.  No change from previous admission.  Blood flow is audible in the carotid, but there are no obvious bruits. RESPIRATORY:  Clear to auscultation bilaterally but decreased throughout.  No focal findings. ABDOMEN:  Soft, nontender, nondistended with  no organomegaly. EXTREMITIES:  No edema on the left.  A 1+ edema to the ankle on the right. SKIN:  No obvious skin breakdown or signs of infection in the entire skin exam. NEURO:  Eye exam as above.  The patient can stand but without assistance, but refuses to walk.  He says that he just does not feel like it right now.  His reflexes are 2+ and symmetric with no facial asymmetry, no dysmetria, and no dysarthria.  There is slight hesitation on full arm extension, but no past pointing.  Speech is slightly hesitant, but there is no change from his previous admission.  Strength is 5+ in all extremities.  LABORATORY DATA AND IMAGING: 1. CMET notable for creatinine of 1.24 and potassium of 3.5. 2. CBC notable for white count of 16.5 and hemoglobin of 8.5. 3. UDS is negative. 4. Blood alcohol was undetectable. 5. CK is 184. 6. Ammonia 27. 7. Head CT shows no acute intracranial findings, a remote deep white     matter infarct, and small sclerotic lesion of the calvarium     suggestive of a metabolic disorder.  ASSESSMENT AND PLAN:  The patient is a 54 year old male who is presenting with questionable dehydration and new neurologic findings per the emergency room physician on exam. 1. Dehydration:  The patient does not appear to be significantly     dehydrated either on exam or by his labs.  We will gently hydrate     the patient with D5 half-normal saline with 20 mEq/L of potassium     as the patient's potassium is on the borderline low side.  We will     perform bladder scan q. shift due to a history of retention. 2. Ataxia:  This is a new finding per the ED physician.  The patient     refuses to walk for me, and other than his problem with the     rightward gaze which may possibly be related to an old infarct, he     has no focal neurologic findings.  We will repeat both his ocular     testing and gait testing in the morning when the patient will     hopefully be slightly more  cooperative with exam.  Depending on the     results of this exam and discussion with attending in the morning,     we will determine if we need to perform a full stroke workup.  If     the patient continues to have no focal neurologic findings, I do     not see any need to consult Neurology at this time.  We will obtain     a fasting lipid panel, TSH, and B12 if these have not been obtained     recently.  We will also consult PT and  OT to assess for any new the     patient needs.  The patient was not amenable to placement during     his last admission and is still not interested in it today.  If     this changes during this admission, we will happily consult Social     Work to attempt to arrange for the patient. 3. Elevated white blood count:  I am not entirely certain what to make     this patient's leukocytosis.  It does not show a significant left     shift and there is no evidence of infection either on exam of the     skin, lungs, or on his urinalysis.  The patient does have a slight     edema of the right foot, but no notable tenderness on exam.  As it     is possible for the patient's leukocytosis to be caused by a DVT,     we will obtain lower extremity Dopplers in the morning to assess     for this. 4. Renal function:  The patient's renal function is slightly improved.     We will continue to follow and give gentle hydration as detailed     above. 5. Anemia:  The patient's anemia appears to be stable at this point in     time.  We will likely start him on an iron supplement in the     morning. 6. History of substance abuse:  The patient's blood alcohol level was     negative, he was only recently discharged from the hospital, and     denies any alcohol consumption.  We will hold off any on CIWA for     now, but we will give the patient today 14 mcg nicotine patch. 7. Social:  The patient is not interested in pursuing placement at     this time.  We will be consulting Social  Work if this changes. 8. Fluids, electrolytes, nutrition/gastrointestinal:  P.o. ad lib,     fluids as above. 9. Prophylaxis:  Subcu heparin. 10.Code status:  Full code. 11.Disposition:  Pending improvement and resolution of his presenting     issues.    ______________________________ Majel Homer, MD   ______________________________ Paula Compton, MD    ER/MEDQ  D:  01/07/2011  T:  01/07/2011  Job:  [id number]  Electronically Signed by Manuela Neptune MD on 01/11/2011 12:52:56 PM Electronically Signed by Paula Compton MD on 01/16/2011 08:52:12 AM

## 2011-01-17 ENCOUNTER — Emergency Department (HOSPITAL_COMMUNITY)
Admission: EM | Admit: 2011-01-17 | Discharge: 2011-01-17 | Disposition: A | Discharge status: Home / Self Care | Payer: Self-pay | Attending: Emergency Medicine | Admitting: Emergency Medicine

## 2011-01-17 DIAGNOSIS — Z0389 Encounter for observation for other suspected diseases and conditions ruled out: Secondary | ICD-10-CM | POA: Insufficient documentation

## 2011-01-17 DIAGNOSIS — Z5900 Homelessness unspecified: Secondary | ICD-10-CM | POA: Insufficient documentation

## 2011-01-17 DIAGNOSIS — X30XXXA Exposure to excessive natural heat, initial encounter: Secondary | ICD-10-CM | POA: Insufficient documentation

## 2011-01-17 DIAGNOSIS — T7589XA Other specified effects of external causes, initial encounter: Secondary | ICD-10-CM | POA: Insufficient documentation

## 2011-01-18 ENCOUNTER — Ambulatory Visit: Payer: Self-pay

## 2011-01-20 ENCOUNTER — Inpatient Hospital Stay (HOSPITAL_COMMUNITY)
Admission: EM | Admit: 2011-01-20 | Discharge: 2011-01-31 | DRG: 923 | Disposition: A | Discharge status: Home / Self Care | Payer: Self-pay | Attending: Internal Medicine | Admitting: Internal Medicine

## 2011-01-20 ENCOUNTER — Emergency Department (HOSPITAL_COMMUNITY): Payer: Self-pay

## 2011-01-20 ENCOUNTER — Ambulatory Visit: Payer: Self-pay

## 2011-01-20 DIAGNOSIS — Z5900 Homelessness unspecified: Secondary | ICD-10-CM

## 2011-01-20 DIAGNOSIS — R269 Unspecified abnormalities of gait and mobility: Secondary | ICD-10-CM | POA: Diagnosis present

## 2011-01-20 DIAGNOSIS — F17200 Nicotine dependence, unspecified, uncomplicated: Secondary | ICD-10-CM | POA: Diagnosis present

## 2011-01-20 DIAGNOSIS — X30XXXA Exposure to excessive natural heat, initial encounter: Secondary | ICD-10-CM | POA: Diagnosis present

## 2011-01-20 DIAGNOSIS — D473 Essential (hemorrhagic) thrombocythemia: Secondary | ICD-10-CM | POA: Diagnosis present

## 2011-01-20 DIAGNOSIS — N179 Acute kidney failure, unspecified: Secondary | ICD-10-CM | POA: Diagnosis present

## 2011-01-20 DIAGNOSIS — R339 Retention of urine, unspecified: Secondary | ICD-10-CM | POA: Diagnosis present

## 2011-01-20 DIAGNOSIS — F1010 Alcohol abuse, uncomplicated: Secondary | ICD-10-CM | POA: Diagnosis present

## 2011-01-20 DIAGNOSIS — B9689 Other specified bacterial agents as the cause of diseases classified elsewhere: Secondary | ICD-10-CM | POA: Diagnosis present

## 2011-01-20 DIAGNOSIS — Z86718 Personal history of other venous thrombosis and embolism: Secondary | ICD-10-CM

## 2011-01-20 DIAGNOSIS — T675XXA Heat exhaustion, unspecified, initial encounter: Principal | ICD-10-CM | POA: Diagnosis present

## 2011-01-20 DIAGNOSIS — Z7901 Long term (current) use of anticoagulants: Secondary | ICD-10-CM

## 2011-01-20 DIAGNOSIS — E860 Dehydration: Secondary | ICD-10-CM | POA: Diagnosis present

## 2011-01-20 DIAGNOSIS — N390 Urinary tract infection, site not specified: Secondary | ICD-10-CM | POA: Diagnosis present

## 2011-01-20 DIAGNOSIS — E876 Hypokalemia: Secondary | ICD-10-CM | POA: Diagnosis present

## 2011-01-20 DIAGNOSIS — E46 Unspecified protein-calorie malnutrition: Secondary | ICD-10-CM | POA: Diagnosis present

## 2011-01-20 DIAGNOSIS — D509 Iron deficiency anemia, unspecified: Secondary | ICD-10-CM | POA: Diagnosis present

## 2011-01-20 LAB — URINALYSIS, ROUTINE W REFLEX MICROSCOPIC
Bilirubin Urine: NEGATIVE
Glucose, UA: NEGATIVE mg/dL
Specific Gravity, Urine: 1.013 (ref 1.005–1.030)
pH: 6 (ref 5.0–8.0)

## 2011-01-20 LAB — CBC
HCT: 31.2 % — ABNORMAL LOW (ref 39.0–52.0)
Hemoglobin: 10.2 g/dL — ABNORMAL LOW (ref 13.0–17.0)
MCH: 29.1 pg (ref 26.0–34.0)
MCHC: 32.7 g/dL (ref 30.0–36.0)
RBC: 3.5 MIL/uL — ABNORMAL LOW (ref 4.22–5.81)

## 2011-01-20 LAB — POCT I-STAT, CHEM 8
BUN: 40 mg/dL — ABNORMAL HIGH (ref 6–23)
Creatinine, Ser: 1.7 mg/dL — ABNORMAL HIGH (ref 0.50–1.35)
Glucose, Bld: 95 mg/dL (ref 70–99)
Hemoglobin: 11.6 g/dL — ABNORMAL LOW (ref 13.0–17.0)
Potassium: 3.8 mEq/L (ref 3.5–5.1)
Sodium: 134 mEq/L — ABNORMAL LOW (ref 135–145)
TCO2: 21 mmol/L (ref 0–100)

## 2011-01-20 LAB — PROTIME-INR
INR: 3.48 — ABNORMAL HIGH (ref 0.00–1.49)
Prothrombin Time: 35.5 seconds — ABNORMAL HIGH (ref 11.6–15.2)

## 2011-01-20 LAB — BASIC METABOLIC PANEL
BUN: 41 mg/dL — ABNORMAL HIGH (ref 6–23)
Chloride: 93 mEq/L — ABNORMAL LOW (ref 96–112)
Creatinine, Ser: 1.61 mg/dL — ABNORMAL HIGH (ref 0.50–1.35)
GFR calc Af Amer: 55 mL/min — ABNORMAL LOW (ref >60)
Glucose, Bld: 91 mg/dL (ref 70–99)
Potassium: 3.9 mEq/L (ref 3.5–5.1)

## 2011-01-20 LAB — URINE MICROSCOPIC-ADD ON

## 2011-01-20 LAB — RAPID URINE DRUG SCREEN, HOSP PERFORMED
Amphetamines: NOT DETECTED
Opiates: NOT DETECTED

## 2011-01-20 NOTE — Discharge Summary (Signed)
[name], [name] NO.:  [id number]  MEDICAL RECORD NO.:  [id number]  LOCATION:  5504                         FACILITY:  [facility]  PHYSICIAN:  Leighton Roach Yariana Hoaglund, M.D.DATE OF BIRTH:  [birth year]  DATE OF ADMISSION:  01/07/2011 DATE OF DISCHARGE:  01/13/2011                              DISCHARGE SUMMARY   PRIMARY CARE PHYSICIAN:  None.  The patient planned for followup with Dr. Tana Conch at [facility] as well as [facility] physicians for INR follow up.   CONSULTANTS:  None.  PRIMARY DISCHARGE DIAGNOSES: 1. Proximal Deep venous thrombosis of the right lower extremity. 2. Dehydration  SECONDARY DISCHARGE DIAGNOSES: 1. Right radial fracture status post casting in May 2012. 2. Chronic obstructive pulmonary disease. 3. Vitamin B12 deficiency anemia. 4. History of polysubstance abuse including cocaine and marijuana. 5. Ethanol abuse. 6. History of hip fracture status post open reduction and internal     fixation in February 2012. 7. History of urinary retention.  DISCHARGE MEDICATIONS:  New medications: 1. Cyanocobalamin/vitamin B12 1000 mcg 1 pill by mouth daily. 2. Multivitamins 1 tablet by mouth daily. 3. Tamsulosin/Flomax 0.4 mg by mouth daily. 4. Thiamine 100 mg by mouth daily. 5. Warfarin 4 mg by mouth once daily.  Continued medications: 1. Combivent/ipratropium/albuterol inhaler 1 puff inhaled every 4     hours as needed.  HOSPITAL COURSE:  [name] is a 55 year old homeless gentleman who presented with dehydration at [facility] and possible ataxia who was transferred to [facility] and was found to have a DVT with no ataxia.  1. DVT of the right lower extremity.  The patient received 5 days of     Lovenox.  He also received 5 days of warfarin.  His INR became     therapeutic on day 4 and he had an overlap of 48 hours with a     therapeutic INR before Lovenox was discontinued.  He had no signs     of shortness of  breath throughout his stay, nor did he have     tachypnea or hypoxemia, so no investigation was underwent for pulmonary embolism.  2. Dehydration.  The patient was admitted after being found outside     for a prolonged period of time without drinking much water.  This     was the second time this had happened to this patient and second     time the patient was admitted for such a problem.  He initially had     an elevated Cr at [facility] before he was transferred to [facility].  This resolved with gentle hydration. 3. Anemia.  The patient was found to have a low vitamin B12 level.  He     was begun on injections and this continued throughout his hospital     stay.  He was discharged with oral vitamin B12. 4. Question of ataxia - the patient was nearly discharged from [facility] when he was noted to be ataxic by the emergency department     physician.  A CT scan was ordered which showed no acute     intracranial findings  but did show remote deep white matter     infarction.  This was unchanged from a previous CT of the head in     May.  The patient was initially resistant standing for the     physicians at [facility].  He did work with PT, OT, and no ataxia     was noted.  Finally on day 4 of admission, an examination of the     patient's gait was performed.  He was stable on his feet but had a     very slow gait, and dependent on nearby objects for stability.  An     element of this potentially could be attributed to deconditioning     from 2 recent hospital stays, but the ataxia was not of new onset.  5. Disposition.  The patient is a homeless gentleman who lives in a     broken down car.  He has very little transportation.  He does not     have a cell phone.  The plan for his INR check is for him to visit     [facility].  He will get regular INR checks on Monday,     Wednesday, Friday of the first 2 weeks.  They will monitor his     Coumadin and INR levels.  There is some  concern that the patient     will not take his Coumadin and will not follow up on his     appointments.  Every effort was made to emphasize the importance of     following up with his blood draws and Coumadin use.   6. Leukocytosis - the patient had a persistent leukocytosis with white     count ranging from 12-16 during this hospitalization and the most     recent previous hospitalization.  The patient previously had blood     cultures and chest x-ray which did not reveal a source.     Leukocytosis was stable and minimally elevated, so no further     workup was deemed necessary. 7. History of urinary retention - the patient required several in-and-     out catheter during the first several days of stay after he had     postvoid bladder scan greater than 500.  The patient was restarted     on Flomax which had been started on the last visit due to a similar issue but was not     continued at the previous discharge.  He had gradual improvement of urine output and postvoid     bladder scans showed reduced levels of urinary retention.  He was     given a prescription for Flomax to continue outside of the     hospital.  PROCEDURES: 1. Venous duplex on January 07, 2011 showed findings consistent with     indeterminate age, deep venous thrombosis involving the popliteal     vein of the right lower extremity.  No evidence of Baker's cyst on     the right or left. 2. CT of the head without contrast.  No acute intracranial findings.     Remote deep white matter infarction.  Small sclerotic lesion of the     calvarium suggest metabolic disorder.  LABS AT DISCHARGE: 1. PT is 28.5, INR 2.6. 2. CBC:  White count 13.4, hemoglobin 9.4, hematocrit 29.2, platelets     of 664,000.  BMP:  137, 3.9, 101, 22, 82, 15, 0.95.  Calcium 8.6.  OTHER IMPORTANT LABS:  1. Anemia panel showed an iron of 45, total iron binding capacity of     200, percent saturation 23, UIBC 155, vitamin B12 214, folate 19,      ferritin of 399. 2. Individualized vitamin B12 level was drawn and the result is 163.     TSH 3.679.  Lipids:  Total cholesterol 157, triglycerides 118, HDL     24, LDL 109, ammonia 27.  UDS negative.  Alcohol less than 11.  The patient condition at the time of discharge stable and deemed safe for discharge.  The patient was instructed to drink plenty of water. The patient was instructed to discontinue taking Coumadin if he did not feel that he was going to be able to regularly follow up with his INR checks and his physician.  The patient refused a rolling walker or cane, although this was recommended by PT.  DISPOSITION:  To homeless shelter/to car that the patient was in with bus pass provided for transportation.  DISCHARGE FOLLOWUP:  The patient will follow up with [facility].  The patient also scheduled for a primary care visit with Dr. Tana Conch at [facility] on July 27 at 1:30 p.m.  FOLLOWUP ISSUES: 1. BMET to monitor creatinine given possible complaints of urinary     retention during the patient's stay. 2. Recheck INR and monitor Coumadin compliance. 3. Counsel the patient on tobacco abuse and drug use.    ______________________________ Tana Conch, MD   ______________________________ Leighton Roach Zaydon Kinser, M.D.    SH/MEDQ  D:  01/13/2011  T:  01/14/2011  Job:  [id number]  cc:   Tana Conch, MD  Electronically Signed by Tana Conch MD on 01/14/2011 11:51:18 PM Electronically Signed by Acquanetta Belling M.D. on 01/20/2011 04:45:05 PM

## 2011-01-21 LAB — BASIC METABOLIC PANEL
BUN: 26 mg/dL — ABNORMAL HIGH (ref 6–23)
CO2: 20 mEq/L (ref 19–32)
Chloride: 101 mEq/L (ref 96–112)
GFR calc Af Amer: >60 mL/min (ref >60)
Potassium: 3.1 mEq/L — ABNORMAL LOW (ref 3.5–5.1)

## 2011-01-21 LAB — CBC
HCT: 25.7 % — ABNORMAL LOW (ref 39.0–52.0)
Hemoglobin: 8.5 g/dL — ABNORMAL LOW (ref 13.0–17.0)
MCH: 29.2 pg (ref 26.0–34.0)
MCHC: 33.1 g/dL (ref 30.0–36.0)
RBC: 2.91 MIL/uL — ABNORMAL LOW (ref 4.22–5.81)

## 2011-01-21 LAB — CK: Total CK: 225 U/L (ref 7–232)

## 2011-01-21 LAB — PROTIME-INR: Prothrombin Time: 41.6 seconds — ABNORMAL HIGH (ref 11.6–15.2)

## 2011-01-21 LAB — DIFFERENTIAL
Basophils Relative: 0 % (ref 0–1)
Lymphocytes Relative: 10 % — ABNORMAL LOW (ref 12–46)
Lymphs Abs: 2.5 10*3/uL (ref 0.7–4.0)
Monocytes Absolute: 2 10*3/uL — ABNORMAL HIGH (ref 0.1–1.0)
Monocytes Relative: 8 % (ref 3–12)
Neutro Abs: 20 10*3/uL — ABNORMAL HIGH (ref 1.7–7.7)
Neutrophils Relative %: 81 % — ABNORMAL HIGH (ref 43–77)

## 2011-01-21 LAB — MAGNESIUM: Magnesium: 1.8 mg/dL (ref 1.5–2.5)

## 2011-01-22 LAB — BASIC METABOLIC PANEL
BUN: 12 mg/dL (ref 6–23)
GFR calc Af Amer: >60 mL/min (ref >60)
GFR calc non Af Amer: >60 mL/min (ref >60)
Potassium: 3.6 mEq/L (ref 3.5–5.1)
Sodium: 137 mEq/L (ref 135–145)

## 2011-01-22 LAB — URINE CULTURE
Colony Count: >=100000
Culture  Setup Time: 201207210243

## 2011-01-22 LAB — DIFFERENTIAL
Band Neutrophils: 0 % (ref 0–10)
Basophils Absolute: 0 10*3/uL (ref 0.0–0.1)
Basophils Relative: 0 % (ref 0–1)
Eosinophils Absolute: 0 10*3/uL (ref 0.0–0.7)
Eosinophils Relative: 0 % (ref 0–5)
Metamyelocytes Relative: 0 %
Monocytes Absolute: 1.9 10*3/uL — ABNORMAL HIGH (ref 0.1–1.0)
Monocytes Relative: 7 % (ref 3–12)

## 2011-01-22 LAB — CBC
HCT: 30.2 % — ABNORMAL LOW (ref 39.0–52.0)
MCHC: 31.1 g/dL (ref 30.0–36.0)
Platelets: 560 10*3/uL — ABNORMAL HIGH (ref 150–400)
RDW: 15.1 % (ref 11.5–15.5)

## 2011-01-23 ENCOUNTER — Ambulatory Visit: Payer: Self-pay

## 2011-01-23 LAB — BASIC METABOLIC PANEL
BUN: 9 mg/dL (ref 6–23)
CO2: 19 mEq/L (ref 19–32)
Chloride: 107 mEq/L (ref 96–112)
Creatinine, Ser: 1.05 mg/dL (ref 0.50–1.35)
Glucose, Bld: 107 mg/dL — ABNORMAL HIGH (ref 70–99)
Potassium: 3.1 mEq/L — ABNORMAL LOW (ref 3.5–5.1)

## 2011-01-23 LAB — URINE CULTURE

## 2011-01-23 LAB — DIFFERENTIAL
Basophils Absolute: 0 10*3/uL (ref 0.0–0.1)
Basophils Relative: 0 % (ref 0–1)
Monocytes Relative: 6 % (ref 3–12)
Neutro Abs: 13.3 10*3/uL — ABNORMAL HIGH (ref 1.7–7.7)
Neutrophils Relative %: 80 % — ABNORMAL HIGH (ref 43–77)

## 2011-01-23 LAB — CBC
Hemoglobin: 7.7 g/dL — ABNORMAL LOW (ref 13.0–17.0)
MCH: 29.1 pg (ref 26.0–34.0)
RBC: 2.65 MIL/uL — ABNORMAL LOW (ref 4.22–5.81)

## 2011-01-23 LAB — PROTIME-INR: Prothrombin Time: 27.6 seconds — ABNORMAL HIGH (ref 11.6–15.2)

## 2011-01-24 LAB — CBC
HCT: 25 % — ABNORMAL LOW (ref 39.0–52.0)
Hemoglobin: 7.9 g/dL — ABNORMAL LOW (ref 13.0–17.0)
MCH: 28.1 pg (ref 26.0–34.0)
MCV: 89 fL (ref 78.0–100.0)
RBC: 2.81 MIL/uL — ABNORMAL LOW (ref 4.22–5.81)
WBC: 15.3 10*3/uL — ABNORMAL HIGH (ref 4.0–10.5)

## 2011-01-24 LAB — BASIC METABOLIC PANEL
CO2: 22 mEq/L (ref 19–32)
Calcium: 8.6 mg/dL (ref 8.4–10.5)
Chloride: 106 mEq/L (ref 96–112)
Creatinine, Ser: 0.94 mg/dL (ref 0.50–1.35)
Glucose, Bld: 92 mg/dL (ref 70–99)

## 2011-01-24 LAB — PROTIME-INR: Prothrombin Time: 32.4 seconds — ABNORMAL HIGH (ref 11.6–15.2)

## 2011-01-25 ENCOUNTER — Ambulatory Visit: Payer: Self-pay

## 2011-01-25 LAB — CBC
HCT: 26 % — ABNORMAL LOW (ref 39.0–52.0)
Hemoglobin: 8.1 g/dL — ABNORMAL LOW (ref 13.0–17.0)
MCHC: 31.2 g/dL (ref 30.0–36.0)
MCV: 90 fL (ref 78.0–100.0)
RDW: 15 % (ref 11.5–15.5)
WBC: 13.9 10*3/uL — ABNORMAL HIGH (ref 4.0–10.5)

## 2011-01-25 LAB — BASIC METABOLIC PANEL
CO2: 25 mEq/L (ref 19–32)
Chloride: 102 mEq/L (ref 96–112)
Glucose, Bld: 101 mg/dL — ABNORMAL HIGH (ref 70–99)
Sodium: 136 mEq/L (ref 135–145)

## 2011-01-25 LAB — DIFFERENTIAL
Basophils Absolute: 0 10*3/uL (ref 0.0–0.1)
Eosinophils Relative: 4 % (ref 0–5)
Lymphocytes Relative: 19 % (ref 12–46)
Lymphs Abs: 2.7 10*3/uL (ref 0.7–4.0)
Monocytes Absolute: 1.2 10*3/uL — ABNORMAL HIGH (ref 0.1–1.0)
Neutro Abs: 9.4 10*3/uL — ABNORMAL HIGH (ref 1.7–7.7)

## 2011-01-25 LAB — PROTIME-INR: INR: 3.17 — ABNORMAL HIGH (ref 0.00–1.49)

## 2011-01-26 LAB — PROTIME-INR: INR: 2.82 — ABNORMAL HIGH (ref 0.00–1.49)

## 2011-01-26 LAB — OCCULT BLOOD X 1 CARD TO LAB, STOOL
Fecal Occult Bld: NEGATIVE
Fecal Occult Bld: NEGATIVE
Fecal Occult Bld: NEGATIVE

## 2011-01-26 LAB — CULTURE, BLOOD (ROUTINE X 2)
Culture  Setup Time: 201207202140
Culture: NO GROWTH

## 2011-01-27 ENCOUNTER — Inpatient Hospital Stay: Payer: Self-pay | Admitting: Family Medicine

## 2011-01-27 LAB — PROTIME-INR: Prothrombin Time: 27.8 seconds — ABNORMAL HIGH (ref 11.6–15.2)

## 2011-01-28 LAB — DIFFERENTIAL
Basophils Absolute: 0 10*3/uL (ref 0.0–0.1)
Basophils Relative: 0 % (ref 0–1)
Eosinophils Absolute: 0.3 10*3/uL (ref 0.0–0.7)
Lymphs Abs: 3.5 10*3/uL (ref 0.7–4.0)
Monocytes Absolute: 1.3 10*3/uL — ABNORMAL HIGH (ref 0.1–1.0)
Neutro Abs: 9.3 10*3/uL — ABNORMAL HIGH (ref 1.7–7.7)

## 2011-01-28 LAB — BASIC METABOLIC PANEL
BUN: 26 mg/dL — ABNORMAL HIGH (ref 6–23)
Creatinine, Ser: 1.03 mg/dL (ref 0.50–1.35)
GFR calc non Af Amer: >60 mL/min (ref >60)
Glucose, Bld: 106 mg/dL — ABNORMAL HIGH (ref 70–99)
Potassium: 3.7 mEq/L (ref 3.5–5.1)

## 2011-01-28 LAB — CBC
MCH: 28.7 pg (ref 26.0–34.0)
MCHC: 31.9 g/dL (ref 30.0–36.0)
MCV: 90.2 fL (ref 78.0–100.0)
Platelets: 812 10*3/uL — ABNORMAL HIGH (ref 150–400)
RBC: 2.75 MIL/uL — ABNORMAL LOW (ref 4.22–5.81)
RDW: 15.2 % (ref 11.5–15.5)

## 2011-01-28 NOTE — H&P (Signed)
NAME:  [name], [name] NO.:  [id number]  MEDICAL RECORD NO.:  [id number]  LOCATION:  WLED                         FACILITY:  [facility]  PHYSICIAN:  Della Goo, M.D. DATE OF BIRTH:  1955/08/04  DATE OF ADMISSION:  01/20/2011 DATE OF DISCHARGE:                             HISTORY & PHYSICAL   DATE OF ADMISSION:  01/20/2011  PRIMARY CARE PHYSICIAN:  None.  CHIEF COMPLAINT:  Heat exhaustion  HISTORY OF PRESENT ILLNESS:  This is a 55 year old homeless male who was brought to the emergency department.  The patient reported being outside in the heat for the past 2 days and feeling as if he had heat exhaustion.  The patient had been seen in the emergency department 2 days before for syncope.  He was also hospitalized from January 07, 2011, through January 14, 2011, for deep venous thrombosis of the right lower extremity as well as dehydration and urinary retention.  His disposition at that time was for the patient to report to the homeless shelter and follow up with [facility] and the patient is to follow up with Dr. Tana Conch for an appointment on January 27, 2011, at 1:30 p.m.Marland Kitchen  PAST MEDICAL HISTORY:  Significant for polysubstance abuse history, alcohol abuse history, urinary retention, recent right radial fracture status post casting in May 2012.  Recent deep venous thrombosis of the right lower extremity, placed on Coumadin therapy.  History of COPD. Status post open reduction and internal fixation of right hip fracture in February 2012.  MEDICATIONS:  The patient reports not taking any medications.  ALLERGIES:  No known drug allergies.  SOCIAL HISTORY:  The patient is homeless.  He reports smoking a half-a- pack of cigarettes daily.  He reports drinking alcohol whenever he can. He reports his last drink of alcohol was 3 weeks ago and he denies any recent substance abuse.  FAMILY HISTORY:  Noncontributory.  REVIEW OF SYSTEMS:  Pertinents as mentioned  above.  PHYSICAL EXAMINATION FINDINGS:  GENERAL:  This is a 55 year old cachectic-appearing Caucasian and toxic appearing male who is in discomfort but no acute distress currently. VITAL SIGNS:  Temperature 100, blood pressure 137/77, heart rate 112, respirations 20, O2 sats 100%. HEENT EXAMINATION:  Normocephalic, atraumatic.  Pupils equally round and reactive to light.  Extraocular movements are intact.  Funduscopic benign.  Nares are patent bilaterally.  Oropharynx is clear.  Mucosa is dry. NECK:  Supple.  Full range of motion.  No thyromegaly, adenopathy, or jugular venous distention. CARDIOVASCULAR:  Mild tachycardia.  No murmurs, gallops or rubs appreciated. LUNGS:  Clear to auscultation bilaterally.  No rales, rhonchi or wheezes. ABDOMEN:  Positive bowel sounds, soft, nontender, nondistended.  No hepatosplenomegaly. EXTREMITIES:  Without cyanosis, clubbing or edema. NEUROLOGIC EXAMINATION:  The patient is mildly confused but he is speaking clearly and answering questions appropriately.  His speech is clear.  Cranial nerves are intact.  He is able to move all 4 of his extremities.  He does have generalized weakness.  There are no focal deficits on examination.  LABORATORY STUDIES:  White blood cell count 32.8, hemoglobin 10.2, hematocrit 31.2, MCV 89.1, platelets 740.  Sodium 134, potassium 3.8, chloride 103, CO2 of 21,  BUN 40, creatinine 1.70 and glucose 95, CK total 453.  Urinalysis, positive urine nitrites, large leukocyte esterase.  Urine white blood cells 21 to 50, urine red blood cells 0 to 2.  X-rays reveals linear scarring at the left lung base, COPD changes seen with hyperaeration.  ASSESSMENT:  A 55 year old male being admitted with 1. Heat exhaustion 2. Dehydration. 3. Urinary tract infection. 4. Leukocytosis and thrombocytosis, possible early sepsis. 5. History of recent deep venous thrombosis of the right lower     extremity. 6. Tobacco abuse. 7.  Polysubstance abuse. 8. Alcohol abuse history. 9. Anemia. 10.Homelessness 11.History of urinary retention.  PLAN:  The patient will be admitted to Med Surgery area.  The patient will be started on IV fluids for rehydration therapy.  Urine studies have been sent for culture and sensitivity and urine drug screen, and urine will also be sent for gonorrhea and Chlamydia.  The patient will be started on empiric antibiotic therapy of Rocephin 1 g IV daily and this will be adjusted pending culture results.  A social work consultation will also be placed.  A PT and INR will be checked and the patient will be placed on the Coumadin protocol with Lovenox bridging at this time.  The patient is a full code.  The patient denies having any recent alcohol or substance abuse.  However, he will be monitored for further changes and the CIWA protocol will be initiated if needed.     Della Goo, M.D.     HJ/MEDQ  D:  01/20/2011  T:  01/20/2011  Job:  [id number]  Electronically Signed by Della Goo M.D. on 01/28/2011 04:54:09 PM

## 2011-01-29 LAB — CBC
HCT: 24.6 % — ABNORMAL LOW (ref 39.0–52.0)
MCV: 90.4 fL (ref 78.0–100.0)
RBC: 2.72 MIL/uL — ABNORMAL LOW (ref 4.22–5.81)
RDW: 15.5 % (ref 11.5–15.5)
WBC: 14 10*3/uL — ABNORMAL HIGH (ref 4.0–10.5)

## 2011-01-29 LAB — BASIC METABOLIC PANEL
CO2: 24 mEq/L (ref 19–32)
Glucose, Bld: 105 mg/dL — ABNORMAL HIGH (ref 70–99)
Potassium: 3.7 mEq/L (ref 3.5–5.1)
Sodium: 137 mEq/L (ref 135–145)

## 2011-01-29 LAB — DIFFERENTIAL
Basophils Relative: 0 % (ref 0–1)
Eosinophils Absolute: 0.3 10*3/uL (ref 0.0–0.7)
Lymphocytes Relative: 17 % (ref 12–46)
Lymphs Abs: 2.4 10*3/uL (ref 0.7–4.0)
Neutro Abs: 10.2 10*3/uL — ABNORMAL HIGH (ref 1.7–7.7)

## 2011-01-29 LAB — RETICULOCYTES
RBC.: 2.65 MIL/uL — ABNORMAL LOW (ref 4.22–5.81)
Retic Count, Absolute: 63.6 10*3/uL (ref 19.0–186.0)

## 2011-01-30 LAB — CBC
Platelets: 726 10*3/uL — ABNORMAL HIGH (ref 150–400)
RDW: 15.3 % (ref 11.5–15.5)
WBC: 10.4 10*3/uL (ref 4.0–10.5)

## 2011-01-30 LAB — BASIC METABOLIC PANEL
Calcium: 8.3 mg/dL — ABNORMAL LOW (ref 8.4–10.5)
GFR calc Af Amer: >60 mL/min (ref >60)
GFR calc non Af Amer: >60 mL/min (ref >60)
Sodium: 138 mEq/L (ref 135–145)

## 2011-01-30 LAB — DIFFERENTIAL
Basophils Absolute: 0 10*3/uL (ref 0.0–0.1)
Basophils Relative: 0 % (ref 0–1)
Eosinophils Absolute: 0.2 10*3/uL (ref 0.0–0.7)
Lymphocytes Relative: 31 % (ref 12–46)
Monocytes Relative: 13 % — ABNORMAL HIGH (ref 3–12)
Neutrophils Relative %: 54 % (ref 43–77)

## 2011-01-30 LAB — PROTIME-INR
INR: 2.41 — ABNORMAL HIGH (ref 0.00–1.49)
Prothrombin Time: 26.6 seconds — ABNORMAL HIGH (ref 11.6–15.2)

## 2011-01-30 LAB — PATHOLOGIST SMEAR REVIEW

## 2011-01-31 ENCOUNTER — Emergency Department (HOSPITAL_COMMUNITY)
Admission: EM | Admit: 2011-01-31 | Discharge: 2011-02-01 | Disposition: A | Discharge status: Home / Self Care | Payer: Self-pay | Attending: Emergency Medicine | Admitting: Emergency Medicine

## 2011-01-31 DIAGNOSIS — Z7901 Long term (current) use of anticoagulants: Secondary | ICD-10-CM | POA: Insufficient documentation

## 2011-01-31 DIAGNOSIS — F1010 Alcohol abuse, uncomplicated: Secondary | ICD-10-CM | POA: Insufficient documentation

## 2011-01-31 DIAGNOSIS — R5381 Other malaise: Secondary | ICD-10-CM | POA: Insufficient documentation

## 2011-01-31 DIAGNOSIS — R5383 Other fatigue: Secondary | ICD-10-CM | POA: Insufficient documentation

## 2011-01-31 DIAGNOSIS — Z86718 Personal history of other venous thrombosis and embolism: Secondary | ICD-10-CM | POA: Insufficient documentation

## 2011-01-31 DIAGNOSIS — D72829 Elevated white blood cell count, unspecified: Secondary | ICD-10-CM | POA: Insufficient documentation

## 2011-01-31 LAB — CROSSMATCH
ABO/RH(D): A POS
Antibody Screen: NEGATIVE
Unit division: 0
Unit division: 0

## 2011-01-31 LAB — CBC
HCT: 31.9 % — ABNORMAL LOW (ref 39.0–52.0)
Hemoglobin: 10.5 g/dL — ABNORMAL LOW (ref 13.0–17.0)
MCHC: 32.9 g/dL (ref 30.0–36.0)

## 2011-01-31 LAB — PROTIME-INR: INR: 2.3 — ABNORMAL HIGH (ref 0.00–1.49)

## 2011-01-31 LAB — DIFFERENTIAL
Eosinophils Relative: 3 % (ref 0–5)
Lymphs Abs: 3.4 10*3/uL (ref 0.7–4.0)
Monocytes Relative: 11 % (ref 3–12)

## 2011-02-01 ENCOUNTER — Emergency Department (HOSPITAL_COMMUNITY)
Admission: EM | Admit: 2011-02-01 | Discharge: 2011-02-01 | Disposition: A | Discharge status: Home / Self Care | Payer: Self-pay | Attending: Emergency Medicine | Admitting: Emergency Medicine

## 2011-02-01 ENCOUNTER — Emergency Department (HOSPITAL_COMMUNITY)
Admission: EM | Admit: 2011-02-01 | Discharge: 2011-02-02 | Disposition: A | Discharge status: Home / Self Care | Payer: Self-pay | Attending: Emergency Medicine | Admitting: Emergency Medicine

## 2011-02-01 DIAGNOSIS — D649 Anemia, unspecified: Secondary | ICD-10-CM | POA: Insufficient documentation

## 2011-02-01 DIAGNOSIS — R51 Headache: Secondary | ICD-10-CM | POA: Insufficient documentation

## 2011-02-01 DIAGNOSIS — D72829 Elevated white blood cell count, unspecified: Secondary | ICD-10-CM | POA: Insufficient documentation

## 2011-02-01 DIAGNOSIS — F1010 Alcohol abuse, uncomplicated: Secondary | ICD-10-CM | POA: Insufficient documentation

## 2011-02-01 LAB — CBC
HCT: 29.6 % — ABNORMAL LOW (ref 39.0–52.0)
Hemoglobin: 9.7 g/dL — ABNORMAL LOW (ref 13.0–17.0)
MCH: 28.7 pg (ref 26.0–34.0)
MCHC: 31.8 g/dL (ref 30.0–36.0)
MCV: 90.1 fL (ref 78.0–100.0)
Platelets: 838 10*3/uL — ABNORMAL HIGH (ref 150–400)
RBC: 3.35 MIL/uL — ABNORMAL LOW (ref 4.22–5.81)
RBC: 3.73 MIL/uL — ABNORMAL LOW (ref 4.22–5.81)
RDW: 15.5 % (ref 11.5–15.5)
WBC: 17.7 10*3/uL — ABNORMAL HIGH (ref 4.0–10.5)

## 2011-02-01 LAB — DIFFERENTIAL
Basophils Relative: 1 % (ref 0–1)
Eosinophils Absolute: 0.1 10*3/uL (ref 0.0–0.7)
Eosinophils Relative: 1 % (ref 0–5)
Eosinophils Relative: 1 % (ref 0–5)
Lymphs Abs: 4.1 10*3/uL — ABNORMAL HIGH (ref 0.7–4.0)
Lymphs Abs: 3.4 10*3/uL (ref 0.7–4.0)
Monocytes Absolute: 1.3 10*3/uL — ABNORMAL HIGH (ref 0.1–1.0)
Monocytes Relative: 6 % (ref 3–12)
Monocytes Relative: 10 % (ref 3–12)
Neutrophils Relative %: 70 % (ref 43–77)
Neutrophils Relative %: 60 % (ref 43–77)

## 2011-02-01 LAB — URINALYSIS, ROUTINE W REFLEX MICROSCOPIC
Bilirubin Urine: NEGATIVE
Bilirubin Urine: NEGATIVE
Hgb urine dipstick: NEGATIVE
Ketones, ur: NEGATIVE mg/dL
Leukocytes, UA: NEGATIVE
Nitrite: NEGATIVE
Nitrite: NEGATIVE
Protein, ur: NEGATIVE mg/dL
Specific Gravity, Urine: 1.01 (ref 1.005–1.030)
Urobilinogen, UA: 0.2 mg/dL (ref 0.0–1.0)
pH: 6.5 (ref 5.0–8.0)

## 2011-02-01 LAB — LIPASE, BLOOD: Lipase: 37 U/L (ref 11–59)

## 2011-02-01 LAB — COMPREHENSIVE METABOLIC PANEL
AST: 27 U/L (ref 0–37)
Albumin: 2.8 g/dL — ABNORMAL LOW (ref 3.5–5.2)
Alkaline Phosphatase: 96 U/L (ref 39–117)
Chloride: 101 mEq/L (ref 96–112)
Potassium: 3.4 mEq/L — ABNORMAL LOW (ref 3.5–5.1)
Total Bilirubin: 0.3 mg/dL (ref 0.3–1.2)

## 2011-02-01 LAB — PROTIME-INR: INR: 1.96 — ABNORMAL HIGH (ref 0.00–1.49)

## 2011-02-01 LAB — APTT: aPTT: 25 seconds (ref 24–37)

## 2011-02-01 LAB — RAPID URINE DRUG SCREEN, HOSP PERFORMED
Barbiturates: NOT DETECTED
Benzodiazepines: NOT DETECTED
Cocaine: NOT DETECTED
Opiates: NOT DETECTED

## 2011-02-01 LAB — POCT I-STAT, CHEM 8
BUN: 22 mg/dL (ref 6–23)
Calcium, Ion: 1.19 mmol/L (ref 1.12–1.32)
Glucose, Bld: 102 mg/dL — ABNORMAL HIGH (ref 70–99)
TCO2: 25 mmol/L (ref 0–100)

## 2011-02-01 NOTE — Discharge Summary (Signed)
NAME:  [name], [name] NO.:  [id number]  MEDICAL RECORD NO.:  [id number]  LOCATION:  1320                         FACILITY:  [facility]  PHYSICIAN:  Altha Harm, MDDATE OF BIRTH:  03-Oct-1955  DATE OF ADMISSION:  01/20/2011 DATE OF DISCHARGE:  01/31/2011                              DISCHARGE SUMMARY   DISCHARGE DISPOSITION:  Home, however, please note the patient is homeless and refuses to go to a shelter or to his parents' home or any assisted living facility.  FINAL DISCHARGE DIAGNOSES: 1. Heat exhaustion, resolved. 2. Enterobacter urinary tract infection, fully treated with 7 days of     appropriate antibiotics. 3. Dehydration, resolved. 4. History of urinary retention, markedly improved. 5. Iron deficiency anemia, likely nutritional, patient, fecal occult     blood negative. 6. Chronic thrombocytosis, etiology unclear. 7. Polysubstance abuse. 8. Alcohol abuse. 9. Homelessness. 10.Gait abnormality. 11.Hypomagnesemia, resolved. 12.Hypokalemia resolved. 13.History of deep venous thrombosis, on chronic Coumadin and     currently therapeutic. 14.Protein-calorie malnutrition.  DISCHARGE MEDICATIONS:  Include the following: 1. Iron sulfate 325 mg p.o. t.i.d. 2. Folic acid 1 mg p.o. daily. 3. Flomax 0.8 mg p.o. daily. 4. Combivent inhaler 1 puff inhaled q.4 h. p.r.n. shortness of breath. 5. Vitamin B12, 1000 mcg p.o. daily. 6. Multivitamins with minerals 1 tablet p.o. daily. 7. Thiamine 100 mg p.o. daily. 8. Warfarin 4 mg p.o. daily.  The patient to have his INR checked on     August 2nd.  CONSULTANTS: 1. Eulogio Ditch, MD, Psychiatry. 2. Phone consultation with Dr. Ezzie Dural, Urology.  PROCEDURES:  None.  DIAGNOSTIC STUDIES:  Two-view chest x-ray done on admission, which shows hyperaeration consistent with COPD.  Many scarring of the left lung base.  No definitive active process.  PRIMARY CARE PHYSICIAN:  Nurse, [facility].  CODE  STATUS:  Full code.  ALLERGIES:  No known drug allergies.  CHIEF COMPLAINT:  Heat exhaustion.  HISTORY OF PRESENT ILLNESS:  Please refer to the H and P by Dr. Della Goo for details of the HPI; however, in short this is a 55 year old homeless gentleman who has capacity to make decisions.  He was discharged from the hospital 7 days before representing with complaints of heat exhaustion and syncope.  Please note that this patient has been counseled on several occasions that he should likely stay in a shelter or seek assisted living placement.  The patient has refused on several occasions with prior hospitalizations.  The patient definitely does have capacity for making his decisions as he is alert and oriented x3.  He is able to keep his appointments with [facility] as scheduled.  He is able to get historical information accurately and is able to verbalize a plan for his care in the future.  HOSPITAL COURSE: 1. Heat exhaustion.  The patient was clearly heat exhausted and     dehydrated.  He was admitted to the hospital and given aggressive     fluid resuscitation with IV fluids.  The patient continued to     improve clinically and his heat exhaustion resolved completely. 2. Enterobacter urinary tract infection.  The patient's appearance of     urine suggested urinary tract infection  when he was admitted.     Urine was sent for culture and revealed enterobacter as the     pathogen for urinary tract infection.  The patient was treated with     Levaquin for a total of 7 days, completing appropriate antibiotic     therapy for the urinary tract infection. 3. Urinary retention.  The patient has a history of chronic urinary     retention.  During this hospitalization, the patient had residuals     of greater than 500, almost 700 cc.  He required several in and out     catheterizations and a Foley catheter was placed for the majority     of his hospitalization.  I spoke with Dr. Su Grand who advised     that the patient's Flomax be increased at 0.8 mg daily. This was     done.  At the time of discharge, the patient has residual of less     than 200.  The patient is urinating approximately 200 cc at a time     in the last 24 hours.  We were unable to obtain a followup urology     appointment with Dr. Sherron Monday or Dr. Brunilda Payor and we have given the     patient the phone numbers to the urology office to try to obtain     those independently. 4. Dehydration, as noted above, the patient was fluid resuscitated and     this was resolved. 5. Iron deficiency anemia.  The patient's serum iron was low at 14.     His fecal occult bloods were all negative.  Despite the negativity,     it is quite possible that this patient could have a     gastrointestinal process that is leading to chronic blood loss.  I     discussed colonoscopy with the patient and he declined at this     time.  The fact that it is most likely contributory, however, to     this patient's iron deficiency anemia, it is likely he has     nutritional state in light of the fact that he is homeless,  he     uses polysubstance including alcohol.  The patient was given IV     iron while hospitalized and he is being discharged with oral iron.     The patient received a transfusion of 2 units of packed red blood     cells, which brought his hemoglobin up from 7.4 to 10.5.  At the     time of discharge, the patient's hemoglobin was 10.5 with a     hematocrit of 31.9. 6. Thrombocytosis.  In reviewing the patient's records, he has chronic     thrombocytosis, the etiology of which is unclear.  The patient will     likely need to follow up maybe with Hematology to further evaluate     this; however, at this time, the patient is refusing I will refer     to the primary care physician to follow up with the patient as an     outpatient.  At time of discharge, the patient has a platelet count     of 764. 7. Hypomagnesemia.   Again, this is likely nutritional or may be as     result of wasting the urine, this was replaced orally. 8. Hypokalemia.  This was replaced orally also. 9. History of DVT prior to present on admission, the patient is on  chronic Coumadin followed by [facility].  The patient was     therapeutic when he arrived and has been therapeutic during this     hospitalization.  Recommendations are for him to continue on his     usual dose to 4 mg per day and to have his INR checked on August     2nd at [facility].  The appointment has been set for him. 10.Homelessness.  The patient is homeless.  We had lengthy discussions     on the patient with the patient about his risk of his home and thus     given his medical conditions.  The patient is alert, oriented x3,     has capacity for decisions, and is adamant about the fact that he     does not want to stay in a shelter, that he is fine staying in a     tent in the parking lot and going to the [facility] to use their     facilities.  The patient also has parents who live in the area and     has been adamant about the fact that he does not like or get along     with his parents and did not want them involved in his affairs at     this time. 11.Gait abnormality.  The patient sustained a fracture in February     2012 and states that since then he has had difficulty with gait.     There was some concern from the personnel of [facility] that the     patient ambulates so slowly that in the heat he is subjected to     heat exhaustion, which is specifically what he came to the hospital     with.  The physical therapist and occupational therapist have tried     relentlessly to work this patient and he has refused.  He has been     up with his walker with only standby assist with nurses.  However,     the patient is at the same risk with his mobility as prior to     hospitalization, however, in the face and having capacity, he is     refusing to pursue  any therapies at this time. 12.Protein-calorie malnutrition.  The patient was seen by nutritionist     who felt if the patient consumed 100% of his meals, he would meet     his daily nutritional requirements.  Please note, however, that the     patient has a propensity for eating sweets and not eating meals.  PHYSICAL EXAMINATION:  VITAL SIGNS: At the time of discharge, the patient is stable. His temperature is 98, heart rate 86, blood pressure 119/74, respiratory rate 18, O2 sats are 98% on room air. GENERAL:  The patient is a chronically ill appearing gentleman. HEENT:  He is normocephalic, atraumatic.  Pupils equally round and reactive to light and accommodation.  Extraocular movements are intact. Oropharynx is moist.  No exudate, erythema, or lesions are noted. NECK:  Trachea is midline.  No masses.  No thyromegaly.  No JVD.  No carotid bruit. RESPIRATORY:  The patient has a normal respiratory effort, equal excursion bilaterally.  No wheezing or rhonchi noted.  CARDIOVASCULAR: He has got a normal S1, S2.  No murmurs, rubs, or gallops noted.  PMI is nondisplaced.  No heaves or thrills on palpation. ABDOMEN:  Soft, nontender, nondistended.  No masses, no hepatosplenomegaly is noted. EXTREMITIES:  No clubbing,  cyanosis, or edema. NEUROLOGIC:  The patient has no focal neurological deficits.  Cranial nerves II through XII are grossly intact. PSYCHIATRIC:  He is alert and oriented x3.  Cognition is intact.  Good recent and remote recall.  There is With some question about his insight.  The patient was evaluated by Psychiatry who found that he had capacity in addition to my finding of capacity.  DIETARY RESTRICTIONS:  None.  PHYSICAL RESTRICTIONS:  Activity as tolerated with the use of the walker.  FOLLOWUP:  The patient is to follow up his [facility] appointments for eligibility on the 2nd and a doctor's appointment on the 8th. Presently, the case manager, Ms. Pearson, is arranging  his Coumadin appointment for Thursday and Friday.  Total time for this discharge process including face-to-face time approximately 37 minutes.     Altha Harm, MD     MAM/MEDQ  D:  01/31/2011  T:  02/01/2011  Job:  [id number]  cc:   Eulogio Ditch, MD  Lindaann Slough, M.D. Fax: [phone]  Martina Sinner, MD Fax: [phone]  Electronically Signed by Marthann Schiller MD on 02/01/2011 03:50:50 PM

## 2011-02-02 ENCOUNTER — Emergency Department (HOSPITAL_COMMUNITY)
Admission: EM | Admit: 2011-02-02 | Discharge: 2011-02-02 | Disposition: A | Discharge status: Home / Self Care | Payer: Self-pay | Attending: Emergency Medicine | Admitting: Emergency Medicine

## 2011-02-02 DIAGNOSIS — X30XXXA Exposure to excessive natural heat, initial encounter: Secondary | ICD-10-CM | POA: Insufficient documentation

## 2011-02-02 DIAGNOSIS — T675XXA Heat exhaustion, unspecified, initial encounter: Secondary | ICD-10-CM | POA: Insufficient documentation

## 2011-02-02 DIAGNOSIS — Z86718 Personal history of other venous thrombosis and embolism: Secondary | ICD-10-CM | POA: Insufficient documentation

## 2011-02-02 DIAGNOSIS — F1010 Alcohol abuse, uncomplicated: Secondary | ICD-10-CM | POA: Insufficient documentation

## 2011-02-02 DIAGNOSIS — Z7901 Long term (current) use of anticoagulants: Secondary | ICD-10-CM | POA: Insufficient documentation

## 2011-02-02 DIAGNOSIS — Z5900 Homelessness unspecified: Secondary | ICD-10-CM | POA: Insufficient documentation

## 2011-02-07 ENCOUNTER — Emergency Department (HOSPITAL_COMMUNITY): Payer: Self-pay

## 2011-02-07 ENCOUNTER — Emergency Department (HOSPITAL_COMMUNITY)
Admission: EM | Admit: 2011-02-07 | Discharge: 2011-02-07 | Disposition: A | Discharge status: Home / Self Care | Payer: Self-pay | Attending: Emergency Medicine | Admitting: Emergency Medicine

## 2011-02-07 DIAGNOSIS — F1010 Alcohol abuse, uncomplicated: Secondary | ICD-10-CM | POA: Insufficient documentation

## 2011-02-07 DIAGNOSIS — S92009A Unspecified fracture of unspecified calcaneus, initial encounter for closed fracture: Secondary | ICD-10-CM | POA: Insufficient documentation

## 2011-02-07 DIAGNOSIS — Z5900 Homelessness unspecified: Secondary | ICD-10-CM | POA: Insufficient documentation

## 2011-02-07 DIAGNOSIS — Z7901 Long term (current) use of anticoagulants: Secondary | ICD-10-CM | POA: Insufficient documentation

## 2011-02-07 DIAGNOSIS — Z86718 Personal history of other venous thrombosis and embolism: Secondary | ICD-10-CM | POA: Insufficient documentation

## 2011-02-07 DIAGNOSIS — W010XXA Fall on same level from slipping, tripping and stumbling without subsequent striking against object, initial encounter: Secondary | ICD-10-CM | POA: Insufficient documentation

## 2011-02-07 DIAGNOSIS — M25579 Pain in unspecified ankle and joints of unspecified foot: Secondary | ICD-10-CM | POA: Insufficient documentation

## 2011-02-08 ENCOUNTER — Emergency Department (HOSPITAL_COMMUNITY)
Admission: EM | Admit: 2011-02-08 | Discharge: 2011-02-08 | Disposition: A | Discharge status: Home / Self Care | Payer: Self-pay | Attending: Emergency Medicine | Admitting: Emergency Medicine

## 2011-02-08 DIAGNOSIS — M79609 Pain in unspecified limb: Secondary | ICD-10-CM | POA: Insufficient documentation

## 2011-02-08 DIAGNOSIS — Z5900 Homelessness unspecified: Secondary | ICD-10-CM | POA: Insufficient documentation

## 2011-02-08 DIAGNOSIS — Z86718 Personal history of other venous thrombosis and embolism: Secondary | ICD-10-CM | POA: Insufficient documentation

## 2011-02-08 DIAGNOSIS — Z9119 Patient's noncompliance with other medical treatment and regimen: Secondary | ICD-10-CM | POA: Insufficient documentation

## 2011-02-08 DIAGNOSIS — M25579 Pain in unspecified ankle and joints of unspecified foot: Secondary | ICD-10-CM | POA: Insufficient documentation

## 2011-02-08 DIAGNOSIS — X58XXXA Exposure to other specified factors, initial encounter: Secondary | ICD-10-CM | POA: Insufficient documentation

## 2011-02-08 DIAGNOSIS — Z7901 Long term (current) use of anticoagulants: Secondary | ICD-10-CM | POA: Insufficient documentation

## 2011-02-08 DIAGNOSIS — F1010 Alcohol abuse, uncomplicated: Secondary | ICD-10-CM | POA: Insufficient documentation

## 2011-02-08 DIAGNOSIS — S92009A Unspecified fracture of unspecified calcaneus, initial encounter for closed fracture: Secondary | ICD-10-CM | POA: Insufficient documentation

## 2011-02-08 DIAGNOSIS — Z91199 Patient's noncompliance with other medical treatment and regimen due to unspecified reason: Secondary | ICD-10-CM | POA: Insufficient documentation

## 2011-02-08 DIAGNOSIS — R000 Tachycardia, unspecified: Secondary | ICD-10-CM | POA: Insufficient documentation

## 2011-02-09 ENCOUNTER — Emergency Department (HOSPITAL_COMMUNITY)
Admission: EM | Admit: 2011-02-09 | Discharge: 2011-02-09 | Disposition: A | Discharge status: Home / Self Care | Payer: Self-pay | Attending: Emergency Medicine | Admitting: Emergency Medicine

## 2011-02-09 DIAGNOSIS — Z7901 Long term (current) use of anticoagulants: Secondary | ICD-10-CM | POA: Insufficient documentation

## 2011-02-09 DIAGNOSIS — Z4789 Encounter for other orthopedic aftercare: Secondary | ICD-10-CM | POA: Insufficient documentation

## 2011-02-09 DIAGNOSIS — R4181 Age-related cognitive decline: Secondary | ICD-10-CM | POA: Insufficient documentation

## 2011-02-09 DIAGNOSIS — M79609 Pain in unspecified limb: Secondary | ICD-10-CM | POA: Insufficient documentation

## 2011-02-12 ENCOUNTER — Emergency Department (HOSPITAL_COMMUNITY)
Admission: EM | Admit: 2011-02-12 | Discharge: 2011-02-12 | Disposition: A | Discharge status: Home / Self Care | Payer: Self-pay | Attending: Emergency Medicine | Admitting: Emergency Medicine

## 2011-02-12 DIAGNOSIS — M249 Joint derangement, unspecified: Secondary | ICD-10-CM | POA: Insufficient documentation

## 2011-02-12 DIAGNOSIS — M79609 Pain in unspecified limb: Secondary | ICD-10-CM | POA: Insufficient documentation

## 2011-02-12 DIAGNOSIS — R5381 Other malaise: Secondary | ICD-10-CM | POA: Insufficient documentation

## 2011-02-12 DIAGNOSIS — L02619 Cutaneous abscess of unspecified foot: Secondary | ICD-10-CM | POA: Insufficient documentation

## 2011-02-12 DIAGNOSIS — Z86718 Personal history of other venous thrombosis and embolism: Secondary | ICD-10-CM | POA: Insufficient documentation

## 2011-02-12 DIAGNOSIS — I824Z9 Acute embolism and thrombosis of unspecified deep veins of unspecified distal lower extremity: Secondary | ICD-10-CM | POA: Insufficient documentation

## 2011-02-12 DIAGNOSIS — Z7901 Long term (current) use of anticoagulants: Secondary | ICD-10-CM | POA: Insufficient documentation

## 2011-02-12 DIAGNOSIS — M7989 Other specified soft tissue disorders: Secondary | ICD-10-CM | POA: Insufficient documentation

## 2011-02-12 DIAGNOSIS — L03039 Cellulitis of unspecified toe: Secondary | ICD-10-CM | POA: Insufficient documentation

## 2011-02-12 DIAGNOSIS — R5383 Other fatigue: Secondary | ICD-10-CM | POA: Insufficient documentation

## 2011-02-12 DIAGNOSIS — M24176 Other articular cartilage disorders, unspecified foot: Secondary | ICD-10-CM | POA: Insufficient documentation

## 2011-02-12 LAB — BASIC METABOLIC PANEL
BUN: 9 mg/dL (ref 6–23)
CO2: 26 mEq/L (ref 19–32)
Chloride: 102 mEq/L (ref 96–112)
Creatinine, Ser: 0.7 mg/dL (ref 0.50–1.35)
Potassium: 3 mEq/L — ABNORMAL LOW (ref 3.5–5.1)

## 2011-02-12 LAB — BASIC METABOLIC PANEL WITH GFR
Calcium: 8.6 mg/dL (ref 8.4–10.5)
GFR calc Af Amer: >60 mL/min (ref >60)
GFR calc non Af Amer: >60 mL/min (ref >60)
Glucose, Bld: 82 mg/dL (ref 70–99)
Sodium: 138 meq/L (ref 135–145)

## 2011-02-12 LAB — PROTIME-INR
INR: 4.84 — ABNORMAL HIGH (ref 0.00–1.49)
Prothrombin Time: 45.9 s — ABNORMAL HIGH (ref 11.6–15.2)

## 2011-02-12 LAB — DIFFERENTIAL
Basophils Absolute: 0 K/uL (ref 0.0–0.1)
Basophils Relative: 0 % (ref 0–1)
Eosinophils Absolute: 0.3 K/uL (ref 0.0–0.7)
Eosinophils Relative: 2 % (ref 0–5)
Lymphocytes Relative: 30 % (ref 12–46)
Lymphs Abs: 3.9 10*3/uL (ref 0.7–4.0)
Monocytes Absolute: 1.5 K/uL — ABNORMAL HIGH (ref 0.1–1.0)
Monocytes Relative: 11 % (ref 3–12)
Neutro Abs: 7.3 10*3/uL (ref 1.7–7.7)
Neutrophils Relative %: 56 % (ref 43–77)

## 2011-02-12 LAB — CBC
HCT: 28.5 % — ABNORMAL LOW (ref 39.0–52.0)
Hemoglobin: 9.2 g/dL — ABNORMAL LOW (ref 13.0–17.0)
MCH: 29 pg (ref 26.0–34.0)
MCHC: 32.3 g/dL (ref 30.0–36.0)
MCV: 89.9 fL (ref 78.0–100.0)
Platelets: 540 K/uL — ABNORMAL HIGH (ref 150–400)
RBC: 3.17 MIL/uL — ABNORMAL LOW (ref 4.22–5.81)
RDW: 14.9 % (ref 11.5–15.5)
WBC: 13.1 10*3/uL — ABNORMAL HIGH (ref 4.0–10.5)

## 2011-02-16 ENCOUNTER — Emergency Department (HOSPITAL_COMMUNITY)
Admission: EM | Admit: 2011-02-16 | Discharge: 2011-02-17 | Disposition: A | Discharge status: Home / Self Care | Payer: Self-pay | Attending: Emergency Medicine | Admitting: Emergency Medicine

## 2011-02-16 DIAGNOSIS — Z86718 Personal history of other venous thrombosis and embolism: Secondary | ICD-10-CM | POA: Insufficient documentation

## 2011-02-16 DIAGNOSIS — E860 Dehydration: Secondary | ICD-10-CM | POA: Insufficient documentation

## 2011-02-17 ENCOUNTER — Emergency Department (HOSPITAL_COMMUNITY)
Admission: EM | Admit: 2011-02-17 | Discharge: 2011-02-17 | Disposition: A | Discharge status: Home / Self Care | Payer: Self-pay | Attending: Emergency Medicine | Admitting: Emergency Medicine

## 2011-02-17 DIAGNOSIS — M66369 Spontaneous rupture of flexor tendons, unspecified lower leg: Secondary | ICD-10-CM | POA: Insufficient documentation

## 2011-02-17 DIAGNOSIS — M79609 Pain in unspecified limb: Secondary | ICD-10-CM | POA: Insufficient documentation

## 2011-02-17 DIAGNOSIS — Z8781 Personal history of (healed) traumatic fracture: Secondary | ICD-10-CM | POA: Insufficient documentation

## 2011-02-18 ENCOUNTER — Emergency Department (HOSPITAL_COMMUNITY)
Admission: EM | Admit: 2011-02-18 | Discharge: 2011-02-18 | Disposition: A | Discharge status: Home / Self Care | Payer: Self-pay | Attending: Emergency Medicine | Admitting: Emergency Medicine

## 2011-02-18 DIAGNOSIS — M25579 Pain in unspecified ankle and joints of unspecified foot: Secondary | ICD-10-CM | POA: Insufficient documentation

## 2011-02-18 DIAGNOSIS — Z86718 Personal history of other venous thrombosis and embolism: Secondary | ICD-10-CM | POA: Insufficient documentation

## 2011-02-18 DIAGNOSIS — E860 Dehydration: Secondary | ICD-10-CM | POA: Insufficient documentation

## 2011-02-18 DIAGNOSIS — Z7901 Long term (current) use of anticoagulants: Secondary | ICD-10-CM | POA: Insufficient documentation

## 2011-02-18 DIAGNOSIS — R5381 Other malaise: Secondary | ICD-10-CM | POA: Insufficient documentation

## 2011-02-18 DIAGNOSIS — Z87828 Personal history of other (healed) physical injury and trauma: Secondary | ICD-10-CM | POA: Insufficient documentation

## 2011-02-18 LAB — POCT I-STAT, CHEM 8
BUN: 18 mg/dL (ref 6–23)
Calcium, Ion: 1.02 mmol/L — ABNORMAL LOW (ref 1.12–1.32)
Chloride: 105 mEq/L (ref 96–112)
Creatinine, Ser: 1 mg/dL (ref 0.50–1.35)
TCO2: 20 mmol/L (ref 0–100)

## 2011-02-19 ENCOUNTER — Emergency Department (HOSPITAL_COMMUNITY)
Admission: EM | Admit: 2011-02-19 | Discharge: 2011-02-19 | Disposition: A | Discharge status: Home / Self Care | Payer: Self-pay | Attending: Emergency Medicine | Admitting: Emergency Medicine

## 2011-02-19 ENCOUNTER — Emergency Department (HOSPITAL_COMMUNITY): Payer: Self-pay

## 2011-02-19 DIAGNOSIS — M79609 Pain in unspecified limb: Secondary | ICD-10-CM | POA: Insufficient documentation

## 2011-02-19 DIAGNOSIS — M25579 Pain in unspecified ankle and joints of unspecified foot: Secondary | ICD-10-CM | POA: Insufficient documentation

## 2011-02-19 DIAGNOSIS — W899XXA Exposure to unspecified man-made visible and ultraviolet light, initial encounter: Secondary | ICD-10-CM | POA: Insufficient documentation

## 2011-02-19 DIAGNOSIS — W19XXXA Unspecified fall, initial encounter: Secondary | ICD-10-CM | POA: Insufficient documentation

## 2011-02-19 DIAGNOSIS — Z86718 Personal history of other venous thrombosis and embolism: Secondary | ICD-10-CM | POA: Insufficient documentation

## 2011-02-19 DIAGNOSIS — L559 Sunburn, unspecified: Secondary | ICD-10-CM | POA: Insufficient documentation

## 2011-02-19 DIAGNOSIS — Z7901 Long term (current) use of anticoagulants: Secondary | ICD-10-CM | POA: Insufficient documentation

## 2011-02-19 DIAGNOSIS — S92009A Unspecified fracture of unspecified calcaneus, initial encounter for closed fracture: Secondary | ICD-10-CM | POA: Insufficient documentation

## 2011-02-20 ENCOUNTER — Emergency Department (HOSPITAL_COMMUNITY)
Admission: EM | Admit: 2011-02-20 | Discharge: 2011-02-20 | Disposition: A | Discharge status: Home / Self Care | Payer: Self-pay | Attending: Emergency Medicine | Admitting: Emergency Medicine

## 2011-02-20 DIAGNOSIS — M79609 Pain in unspecified limb: Secondary | ICD-10-CM | POA: Insufficient documentation

## 2011-02-20 DIAGNOSIS — M25579 Pain in unspecified ankle and joints of unspecified foot: Secondary | ICD-10-CM | POA: Insufficient documentation

## 2011-02-20 DIAGNOSIS — M66369 Spontaneous rupture of flexor tendons, unspecified lower leg: Secondary | ICD-10-CM | POA: Insufficient documentation

## 2011-02-20 DIAGNOSIS — Z86718 Personal history of other venous thrombosis and embolism: Secondary | ICD-10-CM | POA: Insufficient documentation

## 2011-02-21 ENCOUNTER — Emergency Department (HOSPITAL_COMMUNITY)
Admission: EM | Admit: 2011-02-21 | Discharge: 2011-02-21 | Disposition: A | Discharge status: Home / Self Care | Payer: Self-pay | Attending: Emergency Medicine | Admitting: Emergency Medicine

## 2011-02-21 DIAGNOSIS — M79609 Pain in unspecified limb: Secondary | ICD-10-CM | POA: Insufficient documentation

## 2011-02-21 DIAGNOSIS — Z5900 Homelessness unspecified: Secondary | ICD-10-CM | POA: Insufficient documentation

## 2011-02-21 DIAGNOSIS — Z86718 Personal history of other venous thrombosis and embolism: Secondary | ICD-10-CM | POA: Insufficient documentation

## 2011-02-25 ENCOUNTER — Emergency Department (HOSPITAL_COMMUNITY)
Admission: EM | Admit: 2011-02-25 | Discharge: 2011-02-25 | Disposition: A | Discharge status: Home / Self Care | Payer: Self-pay | Attending: Emergency Medicine | Admitting: Emergency Medicine

## 2011-02-25 DIAGNOSIS — Z7901 Long term (current) use of anticoagulants: Secondary | ICD-10-CM | POA: Insufficient documentation

## 2011-02-25 DIAGNOSIS — Z86718 Personal history of other venous thrombosis and embolism: Secondary | ICD-10-CM | POA: Insufficient documentation

## 2011-02-25 DIAGNOSIS — R5383 Other fatigue: Secondary | ICD-10-CM | POA: Insufficient documentation

## 2011-02-25 DIAGNOSIS — R5381 Other malaise: Secondary | ICD-10-CM | POA: Insufficient documentation

## 2011-02-26 ENCOUNTER — Emergency Department (HOSPITAL_COMMUNITY)
Admission: EM | Admit: 2011-02-26 | Discharge: 2011-02-26 | Disposition: A | Discharge status: Home / Self Care | Payer: Self-pay | Attending: Emergency Medicine | Admitting: Emergency Medicine

## 2011-02-26 DIAGNOSIS — Z5900 Homelessness unspecified: Secondary | ICD-10-CM | POA: Insufficient documentation

## 2011-02-26 DIAGNOSIS — Z86718 Personal history of other venous thrombosis and embolism: Secondary | ICD-10-CM | POA: Insufficient documentation

## 2011-02-26 DIAGNOSIS — M79609 Pain in unspecified limb: Secondary | ICD-10-CM | POA: Insufficient documentation

## 2011-02-26 DIAGNOSIS — Z87828 Personal history of other (healed) physical injury and trauma: Secondary | ICD-10-CM | POA: Insufficient documentation

## 2011-02-28 ENCOUNTER — Emergency Department (HOSPITAL_COMMUNITY): Payer: Self-pay

## 2011-02-28 ENCOUNTER — Emergency Department (HOSPITAL_COMMUNITY)
Admission: EM | Admit: 2011-02-28 | Discharge: 2011-02-28 | Disposition: A | Discharge status: Home / Self Care | Payer: Self-pay | Attending: Emergency Medicine | Admitting: Emergency Medicine

## 2011-02-28 DIAGNOSIS — Z86718 Personal history of other venous thrombosis and embolism: Secondary | ICD-10-CM | POA: Insufficient documentation

## 2011-02-28 DIAGNOSIS — W07XXXA Fall from chair, initial encounter: Secondary | ICD-10-CM | POA: Insufficient documentation

## 2011-02-28 DIAGNOSIS — S0990XA Unspecified injury of head, initial encounter: Secondary | ICD-10-CM | POA: Insufficient documentation

## 2011-02-28 DIAGNOSIS — S0100XA Unspecified open wound of scalp, initial encounter: Secondary | ICD-10-CM | POA: Insufficient documentation

## 2011-02-28 DIAGNOSIS — Z7901 Long term (current) use of anticoagulants: Secondary | ICD-10-CM | POA: Insufficient documentation

## 2011-02-28 DIAGNOSIS — Y9229 Other specified public building as the place of occurrence of the external cause: Secondary | ICD-10-CM | POA: Insufficient documentation

## 2011-02-28 LAB — GLUCOSE, CAPILLARY: Glucose-Capillary: 97 mg/dL (ref 70–99)

## 2011-02-28 LAB — ETHANOL: Alcohol, Ethyl (B): <11 mg/dL (ref 0–11)

## 2011-02-28 LAB — PROTIME-INR: INR: 4.96 — ABNORMAL HIGH (ref 0.00–1.49)

## 2011-03-01 ENCOUNTER — Emergency Department (HOSPITAL_COMMUNITY)
Admission: EM | Admit: 2011-03-01 | Discharge: 2011-03-01 | Disposition: A | Discharge status: Home / Self Care | Payer: Self-pay | Attending: Emergency Medicine | Admitting: Emergency Medicine

## 2011-03-01 DIAGNOSIS — Z7901 Long term (current) use of anticoagulants: Secondary | ICD-10-CM | POA: Insufficient documentation

## 2011-03-01 DIAGNOSIS — R51 Headache: Secondary | ICD-10-CM | POA: Insufficient documentation

## 2011-03-03 ENCOUNTER — Encounter (HOSPITAL_COMMUNITY): Payer: Self-pay | Admitting: Radiology

## 2011-03-03 ENCOUNTER — Emergency Department (HOSPITAL_COMMUNITY): Payer: Self-pay

## 2011-03-03 ENCOUNTER — Encounter: Payer: Self-pay | Admitting: Internal Medicine

## 2011-03-03 ENCOUNTER — Inpatient Hospital Stay (HOSPITAL_COMMUNITY)
Admission: EM | Admit: 2011-03-03 | Discharge: 2011-03-11 | DRG: 948 | Disposition: A | Discharge status: Skilled Nursing Facility | Payer: Self-pay | Attending: Internal Medicine | Admitting: Internal Medicine

## 2011-03-03 DIAGNOSIS — D72829 Elevated white blood cell count, unspecified: Secondary | ICD-10-CM | POA: Diagnosis present

## 2011-03-03 DIAGNOSIS — Z9119 Patient's noncompliance with other medical treatment and regimen: Secondary | ICD-10-CM

## 2011-03-03 DIAGNOSIS — J449 Chronic obstructive pulmonary disease, unspecified: Secondary | ICD-10-CM | POA: Diagnosis present

## 2011-03-03 DIAGNOSIS — Z5900 Homelessness unspecified: Secondary | ICD-10-CM

## 2011-03-03 DIAGNOSIS — R791 Abnormal coagulation profile: Principal | ICD-10-CM | POA: Diagnosis present

## 2011-03-03 DIAGNOSIS — F1020 Alcohol dependence, uncomplicated: Secondary | ICD-10-CM | POA: Diagnosis present

## 2011-03-03 DIAGNOSIS — S93499A Sprain of other ligament of unspecified ankle, initial encounter: Secondary | ICD-10-CM | POA: Diagnosis present

## 2011-03-03 DIAGNOSIS — Z7901 Long term (current) use of anticoagulants: Secondary | ICD-10-CM

## 2011-03-03 DIAGNOSIS — Z91199 Patient's noncompliance with other medical treatment and regimen due to unspecified reason: Secondary | ICD-10-CM

## 2011-03-03 DIAGNOSIS — M810 Age-related osteoporosis without current pathological fracture: Secondary | ICD-10-CM | POA: Diagnosis present

## 2011-03-03 DIAGNOSIS — Z79899 Other long term (current) drug therapy: Secondary | ICD-10-CM

## 2011-03-03 DIAGNOSIS — J4489 Other specified chronic obstructive pulmonary disease: Secondary | ICD-10-CM | POA: Diagnosis present

## 2011-03-03 DIAGNOSIS — Z9181 History of falling: Secondary | ICD-10-CM

## 2011-03-03 DIAGNOSIS — F17200 Nicotine dependence, unspecified, uncomplicated: Secondary | ICD-10-CM | POA: Diagnosis present

## 2011-03-03 DIAGNOSIS — E876 Hypokalemia: Secondary | ICD-10-CM | POA: Diagnosis present

## 2011-03-03 DIAGNOSIS — R64 Cachexia: Secondary | ICD-10-CM | POA: Diagnosis present

## 2011-03-03 DIAGNOSIS — M7989 Other specified soft tissue disorders: Secondary | ICD-10-CM | POA: Diagnosis present

## 2011-03-03 DIAGNOSIS — W19XXXA Unspecified fall, initial encounter: Secondary | ICD-10-CM | POA: Diagnosis present

## 2011-03-03 LAB — CBC
HCT: 28.5 % — ABNORMAL LOW (ref 39.0–52.0)
MCHC: 32.6 g/dL (ref 30.0–36.0)
Platelets: 531 10*3/uL — ABNORMAL HIGH (ref 150–400)
RDW: 15.5 % (ref 11.5–15.5)
WBC: 11.1 10*3/uL — ABNORMAL HIGH (ref 4.0–10.5)

## 2011-03-03 LAB — DIFFERENTIAL
Basophils Absolute: 0 10*3/uL (ref 0.0–0.1)
Basophils Relative: 0 % (ref 0–1)
Eosinophils Absolute: 0.2 10*3/uL (ref 0.0–0.7)
Eosinophils Relative: 2 % (ref 0–5)
Lymphocytes Relative: 23 % (ref 12–46)
Lymphs Abs: 2.5 10*3/uL (ref 0.7–4.0)
Monocytes Absolute: 1.2 10*3/uL — ABNORMAL HIGH (ref 0.1–1.0)
Monocytes Relative: 11 % (ref 3–12)
Neutro Abs: 7.1 10*3/uL (ref 1.7–7.7)
Neutrophils Relative %: 64 % (ref 43–77)

## 2011-03-03 LAB — POCT I-STAT, CHEM 8
Glucose, Bld: 84 mg/dL (ref 70–99)
HCT: 30 % — ABNORMAL LOW (ref 39.0–52.0)
Hemoglobin: 10.2 g/dL — ABNORMAL LOW (ref 13.0–17.0)
Potassium: 3.4 mEq/L — ABNORMAL LOW (ref 3.5–5.1)
Sodium: 140 mEq/L (ref 135–145)
TCO2: 25 mmol/L (ref 0–100)

## 2011-03-03 LAB — GLUCOSE, CAPILLARY: Glucose-Capillary: 127 mg/dL — ABNORMAL HIGH (ref 70–99)

## 2011-03-03 LAB — PROTIME-INR: Prothrombin Time: 56.2 seconds — ABNORMAL HIGH (ref 11.6–15.2)

## 2011-03-03 NOTE — H&P (Signed)
Hospital Admission Note Date: 03/03/2011  Patient name:  [name]  Medical record number:  [id number] Date of birth:  [birth year]  Age: 55 y.o. Gender:  male PCP:    No primary provider on file.  Medical Service:   Internal Medicine Teaching Service   Attending physician:  Dr. Ulyess Mort First Contact:   Dr. Candy Sledge  Pager: [phone] Second Contact:   Dr. Loistine Chance  Pager: [phone] After Hours:    First Contact   Pager: [phone]      Second Contact  Pager: [phone]   Chief Complaint: Fall  History of Present Illness: Patient is a 55 y.o. male with a PMHx of polysubstance abuse, homelessness and DVT on Coumadin comes in to the emergency department after a fall last night. Patient has had multiple falls in the past. The most recent fall was on Monday when he had a laceration with 3 staples placed. Patient said that he was given 3 tablets of Tylenol PM by his friend and he took all of them together. Shortly after that his feet got "mixed up" and he fell. There were no apparent injuries after the fall. Patient denied any chest pain, shortness of breath, bleeding, head injury, change in bladder moment, change in bowel movements. Patient is compliant with Coumadin and does not take any other medicines. Patient also complains of pain in his right lower extremity where he had his achilles tendon ruptured a few days ago. Patient also has a bruise and an ulcer of the left lower extremity. No other complaints at this time.    Current Outpatient Medications: 1. Iron sulfate 325 mg p.o. t.i.d.   2. Folic acid 1 mg p.o. daily.   3. Flomax 0.8 mg p.o. daily.   4. Combivent inhaler 1 puff inhaled q.4 h. p.r.n. shortness of breath.   5. Vitamin B12, 1000 mcg p.o. daily.   6. Multivitamins with minerals 1 tablet p.o. daily.   7. Thiamine 100 mg p.o. daily.   8. Warfarin 4 mg p.o. daily.  The patient to have his INR checked on       August 2nd. Patient takes only Coumadin out of all the medications  mentioned above.  Allergies: Review of patient's allergies indicates no known allergies.  Past Medical History: Mainly alcohol dependence tobacco dependence multiple fractures wrist fracture, femur fracture, achilis tendon rupture.  polysubstance abuse history,  urinary retention recent right radial fracture status post casting in May 2012.  Deep venous thrombosis of the right lower extremity, placed on Coumadin therapy.   History of COPD     Past Surgical History: Status post open reduction and internal fixation of right hip fracture in February 2012.   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 He has had 33 visits to the emergency room since January. Patient said that he moved to [facility] because he felt that [facility] "dried out". His parents live in [place] but he doesn't get along with them. Patient was discharged to a shelter home in [facility] once but he claims that they dropped him to [facility] ER as they could not take care of him. He is afraid of going to a shelter home after that incident.  He claims that he used to drink about a pint of vodka every day since last one year he has slowed down significantly. He drinks 1 to 2 times a week and doesn't give nay more specifics.   Review of Systems: Pertinent items are noted in HPI.  Vital Signs: T:  97.0 P:  79  BP:  128/76  RR:  18  O2 sat:  100% on room    Physical Exam: General: Very poorly kept, cachectic looking, white man who looks older than his age. In no acute distress.  Head: Normocephalic, 3 staples present just above the forehead  Eyes: PERRL, EOMI, No signs of anemia or jaundince.  Ears: TM nonerythematous,  not bulging, good light reflex bilaterally.  Nose: Mucous membranes moist, not inflammed, nonerythematous.  Throat: Oropharynx nonerythematous, no exudate appreciated.   Neck: No deformities, masses, or tenderness noted.Supple, No carotid Bruits, no JVD.  Lungs:  Normal respiratory effort. Clear to auscultation BL without crackles or wheezes.  Heart: RRR. S1 and S2 normal without gallop, murmur, or rubs.  Abdomen:  BS normoactive. Soft, Nondistended, non-tender.  No masses or organomegaly.  Extremities:  right extremity is wrapped up. Left lower extremity had extensive redness and bruising on the ventral aspect, multiple bulla present suggestive of infection   Neurologic: A&O X3, CN II - XII are grossly intact. Motor strength is 5/5 in the all 4 extremities, Sensations intact to light touch, Cerebellar signs negative.  Skin:  poorly kept skin with multiple lesions in both lower extremities   Lab results: CBC:    Component Value Date/Time   WBC 11.1* 03/03/2011 1106   HGB 10.2* 03/03/2011 1118   HCT 30.0* 03/03/2011 1118   PLT 531* 03/03/2011 1106   MCV 89.9 03/03/2011 1106   NEUTROABS 7.1 03/03/2011 1106   LYMPHSABS 2.5 03/03/2011 1106   MONOABS 1.2* 03/03/2011 1106   EOSABS 0.2 03/03/2011 1106   BASOSABS 0.0 03/03/2011 1106      Comprehensive Metabolic Panel:    Component Value Date/Time   NA 140 03/03/2011 1118   K 3.4* 03/03/2011 1118   CL 104 03/03/2011 1118   CO2 26 02/12/2011 1847   BUN 14 03/03/2011 1118   CREATININE 0.90 03/03/2011 1118   GLUCOSE 84 03/03/2011 1118   CALCIUM 8.6 02/12/2011 1847   AST 27 02/01/2011 0027   ALT 104* 02/01/2011 0027   ALKPHOS 96 02/01/2011 0027   BILITOT 0.3 02/01/2011 0027   PROT 7.3 02/01/2011 0027   ALBUMIN 2.8* 02/01/2011 0027     Lab Results  Component Value Date   CKTOTAL 225 01/21/2011   INR 6.27   Assessment & Plan:  55 year old homeless man who comes in after a fall and supratherapeutic INR.  #1 supratherapeutic INR: Hold Coumadin and admit  to regular bed for observation of any bleeding episodes.  #2 homeless: Patient does need a social work consult for placement and management of his social situation.   #3 fall: This is most likely medication induced and the patient will be counseled appropriately to avoid such situation in the future.  #4 DVT: Continue Coumadin as per pharmacy. Patient still has a cast on his right lower extremity which makes him prone to get further thromboembolic events.   #5 polysubstance abuse: Continue folic acid and thiamine.  DVT PPX: Already supratherapeutic     (PGY1):  ____________________________________    Date/ Time:      ____________________________________     Lars Mage, M.D. (Senior resident):  ____________________________________    Date/ Time:      ____________________________________     I have seen and examined the patient. I reviewed the resident/fellow note and agree with the findings and plan of care as documented. My additions and revisions are included.   Signature:  ____________________________________________     Internal Medicine Teaching Service Attending    Date:    ____________________________________________

## 2011-03-04 ENCOUNTER — Inpatient Hospital Stay (HOSPITAL_COMMUNITY): Payer: Self-pay

## 2011-03-04 DIAGNOSIS — M249 Joint derangement, unspecified: Secondary | ICD-10-CM

## 2011-03-04 DIAGNOSIS — W19XXXA Unspecified fall, initial encounter: Secondary | ICD-10-CM

## 2011-03-04 DIAGNOSIS — F1010 Alcohol abuse, uncomplicated: Secondary | ICD-10-CM

## 2011-03-04 LAB — CBC
HCT: 30 % — ABNORMAL LOW (ref 39.0–52.0)
Hemoglobin: 9.6 g/dL — ABNORMAL LOW (ref 13.0–17.0)
MCH: 28.8 pg (ref 26.0–34.0)
MCV: 90.1 fL (ref 78.0–100.0)
Platelets: 532 10*3/uL — ABNORMAL HIGH (ref 150–400)
RBC: 3.33 MIL/uL — ABNORMAL LOW (ref 4.22–5.81)
WBC: 11.3 10*3/uL — ABNORMAL HIGH (ref 4.0–10.5)

## 2011-03-04 LAB — BASIC METABOLIC PANEL
BUN: 13 mg/dL (ref 6–23)
CO2: 25 mEq/L (ref 19–32)
Chloride: 104 mEq/L (ref 96–112)
GFR calc non Af Amer: >60 mL/min (ref >60)
Glucose, Bld: 105 mg/dL — ABNORMAL HIGH (ref 70–99)
Potassium: 3.1 mEq/L — ABNORMAL LOW (ref 3.5–5.1)
Sodium: 139 mEq/L (ref 135–145)

## 2011-03-04 LAB — RAPID URINE DRUG SCREEN, HOSP PERFORMED
Amphetamines: NOT DETECTED
Opiates: NOT DETECTED

## 2011-03-05 LAB — BASIC METABOLIC PANEL
CO2: 24 mEq/L (ref 19–32)
Chloride: 106 mEq/L (ref 96–112)
GFR calc Af Amer: >60 mL/min (ref >60)
Potassium: 3.5 mEq/L (ref 3.5–5.1)
Sodium: 140 mEq/L (ref 135–145)

## 2011-03-05 LAB — CK: Total CK: 240 U/L — ABNORMAL HIGH (ref 7–232)

## 2011-03-05 LAB — PROTIME-INR
INR: 4.32 — ABNORMAL HIGH (ref 0.00–1.49)
Prothrombin Time: 42 seconds — ABNORMAL HIGH (ref 11.6–15.2)

## 2011-03-06 LAB — BASIC METABOLIC PANEL
BUN: 16 mg/dL (ref 6–23)
Chloride: 103 mEq/L (ref 96–112)
GFR calc Af Amer: >60 mL/min (ref >60)
GFR calc non Af Amer: >60 mL/min (ref >60)
Glucose, Bld: 99 mg/dL (ref 70–99)
Potassium: 3.8 mEq/L (ref 3.5–5.1)
Sodium: 141 mEq/L (ref 135–145)

## 2011-03-06 LAB — CBC
HCT: 31.3 % — ABNORMAL LOW (ref 39.0–52.0)
Hemoglobin: 10.1 g/dL — ABNORMAL LOW (ref 13.0–17.0)
MCHC: 32.3 g/dL (ref 30.0–36.0)
WBC: 9 10*3/uL (ref 4.0–10.5)

## 2011-03-06 LAB — PROTIME-INR: INR: 3.18 — ABNORMAL HIGH (ref 0.00–1.49)

## 2011-03-07 LAB — PROTIME-INR
INR: 2.21 — ABNORMAL HIGH (ref 0.00–1.49)
Prothrombin Time: 24.9 seconds — ABNORMAL HIGH (ref 11.6–15.2)

## 2011-03-08 DIAGNOSIS — W19XXXA Unspecified fall, initial encounter: Secondary | ICD-10-CM

## 2011-03-08 DIAGNOSIS — M249 Joint derangement, unspecified: Secondary | ICD-10-CM

## 2011-03-08 DIAGNOSIS — F1010 Alcohol abuse, uncomplicated: Secondary | ICD-10-CM

## 2011-03-08 LAB — PROTIME-INR
INR: 1.82 — ABNORMAL HIGH (ref 0.00–1.49)
Prothrombin Time: 21.4 seconds — ABNORMAL HIGH (ref 11.6–15.2)

## 2011-03-08 LAB — CBC
HCT: 32.8 % — ABNORMAL LOW (ref 39.0–52.0)
Hemoglobin: 10.5 g/dL — ABNORMAL LOW (ref 13.0–17.0)
MCH: 29.2 pg (ref 26.0–34.0)
MCHC: 32 g/dL (ref 30.0–36.0)
RDW: 15.4 % (ref 11.5–15.5)

## 2011-03-09 LAB — PROTIME-INR: INR: 2.6 — ABNORMAL HIGH (ref 0.00–1.49)

## 2011-03-10 LAB — BASIC METABOLIC PANEL
BUN: 24 mg/dL — ABNORMAL HIGH (ref 6–23)
CO2: 28 mEq/L (ref 19–32)
Calcium: 9.7 mg/dL (ref 8.4–10.5)
Chloride: 101 mEq/L (ref 96–112)
Creatinine, Ser: 0.78 mg/dL (ref 0.50–1.35)
Glucose, Bld: 79 mg/dL (ref 70–99)

## 2011-03-10 LAB — CBC
HCT: 31 % — ABNORMAL LOW (ref 39.0–52.0)
Hemoglobin: 9.9 g/dL — ABNORMAL LOW (ref 13.0–17.0)
MCH: 29.3 pg (ref 26.0–34.0)
MCHC: 31.9 g/dL (ref 30.0–36.0)
RDW: 15.8 % — ABNORMAL HIGH (ref 11.5–15.5)

## 2011-03-11 DIAGNOSIS — W19XXXA Unspecified fall, initial encounter: Secondary | ICD-10-CM

## 2011-03-11 DIAGNOSIS — M249 Joint derangement, unspecified: Secondary | ICD-10-CM

## 2011-03-11 DIAGNOSIS — F1010 Alcohol abuse, uncomplicated: Secondary | ICD-10-CM

## 2011-03-11 LAB — PROTIME-INR: Prothrombin Time: 24.5 seconds — ABNORMAL HIGH (ref 11.6–15.2)

## 2011-03-19 NOTE — Discharge Summary (Signed)
[name] Kitchen  [name], [name] NO.:  [id number]  MEDICAL RECORD NO.:  [id number]  LOCATION:  5531                         FACILITY:  [facility]  PHYSICIAN:  Kevin Poisson, MD     DATE OF BIRTH:  1956/06/27  DATE OF ADMISSION:  03/03/2011 DATE OF DISCHARGE:  03/11/2011                              DISCHARGE SUMMARY   DISCHARGE DIAGNOSES: 1. Supratherapeutic INR. 2. Deep vein thrombosis. 3. Achilles tendon rupture. 4. Soft tissue infection at posterior left lower leg.  DISCHARGE MEDICATIONS: 1. Tylenol 325 mg p.o. q.6 h. p.r.n. 2. Calcium carbonate 1250 mg p.o. daily. 3. Cholecalciferol 100 units p.o. daily. 4. Vitamin B12 1000 p.o. daily. 5. Folic acid 1 mg tablet p.o. daily. 6. Multivitamin 1 capsule p.o. daily. 7. Tamsulosin 0.4 mg p.o. daily. 8. Thiamine 100 mg p.o. daily. 9. Warfarin 1 mg tablet p.o. daily.  DISPOSITION: The patient will be called by the Internal Medicine  Outpatient Clinic to make an appointment for next Wednesday.  It  is important to check his INR since he is still on Coumadin.  PROCEDURE PERFORMED: 1. Right ankle x-ray which showed diffuse osteopenia.  Avulsion     fracture consistent with Achilles tendon insertion site. 2. CT head without contrast shows scalp laceration without underlying     acute intracranial abnormalities.  Atrophy and chronic     microvascular ischemic change. 3. Right leg and ankle cast was placed.  CONSULTATIONS:  None.  BRIEF ADMITTING HISTORY:  The patient is a 55 year old man with a past medical history of polysubstance abuse, homelessness, and DVT on Coumadin, who comes into the Emergency Department after a fall the night  before the admission.  He has had multiple falls in the past.  The most  recent fall was on Monday resulting in a laceration.  He tripped on his  feet and fell, per his story.  The only injury was a laceration.  He  denied any chest pain, shortness of breath, bleeding, head injury, or  bowel or bladder incontinence.  He takes his comadin but  does not present to his INR monitoring appointments.  He also complains of pain in his right lower extremity where he had an Achilles tendon  rupture a few days ago.  He also has a bruise and ulcer of left lower  leg posteriorly.  PHYSICAL EXAMINATION:    ADMISSION VITAL SIGNS:  Temperature 97, blood pressure 128/76, heart  rate 79, respiration rate 22, oxygen saturation 100% on room air.  GENERAL:  Looks very tired, no acute distress. HEAD:  Nontraumatic, normocephalic. EYES:  Pupils equal, round, and reactive to the right.  No signs of  anemia or jaundice. EARS:  Normal tympanic membranes.  Good light reflexes bilaterally. NECK:  Supple.  No JVD or bruits. LUNGS:  Good air movement bilaterally.  No rales, rhonchi, wheezing,  or rubs. HEART:  Regular rhythm and rate.  S1, S2.  No murmurs, gallops, or rubs. ABDOMEN:  Bowel sounds are normal.  Soft, nondistended, nontender, without masses or organomegaly. EXTREMITIES:  Right lower extremity is wrapped up.  Left lower extremity with redness and bruising on ventral aspect, multiple bullae present suggestive of infection. NEUROLOGIC:  Alert, oriented to place, time, and person.  Intracranial nerves II through XII are grossly intact.  Normal strength in all muscle  groups.  Normal sensation to the light touch.  LABORATORY DATA:  PT 24.5, INR 6.7.  HOSPITAL COURSE: 1. Supratherapeutic INR.  [name] was on Coumadin, but never attended     his INR monitoring appointments.  On admission his INR was 6.7 and his     Coumadin was held.  His INR trended down to the therapeutic range and     the Coumadin was restarted.  At the time of discharge, his INR was 2.16.  2. Achilles tendon fracture.  [name] had an x-ray of his right ankle     on February 07, 2011 at [facility] because of foot pain.  He     was found to have avulsion fracture of Achilles tendon insertion site.      A cast was placed on February 28, 2011.  His ankle cast was replaced      during this stay.  He needs to be in a cast for at least 4- 6 weeks.  3. DVT.  There were no signs of an acute DVT in the hospital.  He was      discharged on Coumadin.  4. Soft tissue infection at the left lower leg posteriorly.  [name] had     a leukocytosis.  He was treated with p.o. doxycycline for 7 days.  DISCHARGE VITAL SIGNS:  Temperature 97.7, blood pressure 130/75, pulse 75, respirations 20, and oxygen saturation 98% on room air.  DISCHARGE LABORATORY DATA:  PT 24.4, INR 2.16, sodium 138, potassium 4.4, chloride 101, bicarbonate 28, BUN 24, creatinine 0.78, glucose 79.   ______________________________ Kevin Harp, MD   ______________________________ Kevin Poisson, MD   NX/MEDQ  D:  03/11/2011  T:  03/11/2011  Job:  [id number]  cc:   Internal Medicine Outpatient Clinic  Electronically Signed by Kevin Harp MD on 03/19/2011 07:11:08 AM Electronically Signed by [name]  on 03/19/2011 09:47:59 AM

## 2011-04-11 ENCOUNTER — Encounter: Payer: Self-pay | Admitting: Internal Medicine

## 2011-04-11 ENCOUNTER — Ambulatory Visit (INDEPENDENT_AMBULATORY_CARE_PROVIDER_SITE_OTHER): Payer: Self-pay | Admitting: Internal Medicine

## 2011-04-11 DIAGNOSIS — F1910 Other psychoactive substance abuse, uncomplicated: Secondary | ICD-10-CM

## 2011-04-11 DIAGNOSIS — O2230 Deep phlebothrombosis in pregnancy, unspecified trimester: Secondary | ICD-10-CM

## 2011-04-11 DIAGNOSIS — S86019A Strain of unspecified Achilles tendon, initial encounter: Secondary | ICD-10-CM

## 2011-04-11 DIAGNOSIS — I82409 Acute embolism and thrombosis of unspecified deep veins of unspecified lower extremity: Secondary | ICD-10-CM

## 2011-04-11 DIAGNOSIS — S93499A Sprain of other ligament of unspecified ankle, initial encounter: Secondary | ICD-10-CM

## 2011-04-11 DIAGNOSIS — S72009A Fracture of unspecified part of neck of unspecified femur, initial encounter for closed fracture: Secondary | ICD-10-CM

## 2011-04-11 DIAGNOSIS — S5290XA Unspecified fracture of unspecified forearm, initial encounter for closed fracture: Secondary | ICD-10-CM

## 2011-04-11 DIAGNOSIS — R339 Retention of urine, unspecified: Secondary | ICD-10-CM

## 2011-04-11 HISTORY — DX: Other psychoactive substance abuse, uncomplicated: F19.10

## 2011-04-11 HISTORY — DX: Retention of urine, unspecified: R33.9

## 2011-04-11 NOTE — Assessment & Plan Note (Signed)
Stable . Currently on Flomax.

## 2011-04-11 NOTE — Assessment & Plan Note (Signed)
Currently on Coumadin. INR in therapeutic range with 2.9. Patient continues to be at risk since patient has cast in place. Patient is currently at a rehab facility and INR is monitored closely. At the time of discharge from the facility patient should not be place on coumadin since close monitoring can not be guarenteed . Patient was admitted in 03/2011 for supratherapeutic INR. Furthermore patient has history of alcohol abuse and recurrent falls.

## 2011-04-11 NOTE — Progress Notes (Deleted)
  Subjective:    Patient ID: [name], male    DOB: 03/19/1956, 54 y.o.   MRN: [id number]  HPI    Review of Systems     Objective:   Physical Exam        Assessment & Plan:   

## 2011-04-11 NOTE — Progress Notes (Addendum)
Subjective:   Patient ID: [name] male   DOB: Feb 28, 1956 55 y.o.   MRN: [id number]  HPI: [name] is a 55 y.o. male with PMH significant as outlined below who presented to the clinic for hospital follow up and establish care the outpatient clinic. Patient was discharged to rehab facility and continues to receive PT . Patient noted that he is doing fine and was wondering if I could take off his cast. I informed him that we do this but he has to follow up with [facility] ( [name] or [name] who were contacted during hospitalization ) .   Past Medical History  Diagnosis Date  . Achilles tendon rupture 01/09/2011  . Radial fracture 11/09/2010  . Polysubstance abuse 04/11/2011  . Hip fracture 08/11/2010  . Urinary retention 04/11/2011  . DVT (deep venous thrombosis) 01/09/2011   Current Outpatient Prescriptions  Medication Sig Dispense Refill  . acetaminophen (TYLENOL) 325 MG tablet Take 650 mg by mouth every 6 (six) hours as needed.        . calcium carbonate 1250 MG capsule Take 1,250 mg by mouth daily.        . Cholecalciferol (VITAMIN D3) 2000 UNITS TABS Take 2,000 Units by mouth.        . folic acid (FOLVITE) 1 MG tablet Take 1 mg by mouth daily.        . Multiple Vitamin (MULTIVITAMIN) tablet Take 1 tablet by mouth daily.        . Tamsulosin HCl (FLOMAX) 0.4 MG CAPS Take 0.4 mg by mouth daily.        Marland Kitchen thiamine 100 MG tablet Take 100 mg by mouth daily.        . vitamin B-12 (CYANOCOBALAMIN) 100 MCG tablet Take 50 mcg by mouth daily.        . vitamin B-12 (CYANOCOBALAMIN) 1000 MCG tablet Take 1,000 mcg by mouth daily.        Marland Kitchen warfarin (COUMADIN) 3 MG tablet Take 3 mg by mouth daily. INR checked at [facility].        No family history on file. History   Social History  . Marital Status: Single    Spouse Name: N/A    Number of Children: N/A  . Years of Education: N/A   Social History Main Topics  . Smoking status: Current Everyday  Smoker -- 0.4 packs/day    Types: Cigarettes  . Smokeless tobacco: None  . Alcohol Use: No     Patient was homeless and was a heavy drinker prior to  hospitilization 03/04/2011.  He used to drink pint of vodka every day since but since last one year he has slowed down significantly. He drinks 1 to 2 times a week .   Marland Kitchen Drug Use: No     Patient was homeless and was using cocaine and marijuana prior hospitilization 03/04/2011 .    Marland Kitchen Sexually Active: None   Other Topics Concern  . None   Social History Narrative  . 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 He has had 33 visits to the emergency room since January. Patient said that he moved to [facility] because he felt that [facility] "dried out". His parents live in [place]  but he doesn't get along with them. Patient was discharged to a shelter home in [facility] once but he claims that they dropped him to [facility] ER as they could not take care of him. He is afraid of going to a shelter home after that incident.   Review of Systems: Constitutional: Denies fever, chills, diaphoresis, appetite change and fatigue.  HEENT: Denies photophobia, eye pain, redness, hearing loss, ear pain, congestion, sore throat, rhinorrhea, sneezing, mouth sores, trouble swallowing, neck pain, neck stiffness and tinnitus.   Respiratory: Denies SOB, DOE, cough, chest tightness,  and wheezing.   Cardiovascular: Denies chest pain, palpitations and leg swelling.  Gastrointestinal: Denies nausea, vomiting, abdominal pain, diarrhea, constipation, blood in stool and abdominal distention.  Genitourinary: Denies dysuria, urgency, frequency, hematuria, flank pain and difficulty urinating.   Neurological: Denies dizziness, seizures, syncope,  weakness, light-headedness, numbness and headaches.    Objective:  Physical Exam: Filed Vitals:   04/11/11 0839  BP: 112/82  Pulse: 100  Temp: 97.2 F (36.2 C)  TempSrc: Oral  Height: 5\' 11"  (1.803 m)  Weight: 135 lb 3.2 oz (61.326 kg)   Constitutional: Vital signs reviewed.  Patient is a well-developed and well-nourished  in no acute distress and cooperative with exam. Alert and oriented x3.  Neck: Supple, Trachea midline normal ROM, No JVD, mass, thyromegaly, or carotid bruit present.  Cardiovascular: tachycardic, S1 normal, S2 normal, no MRG, pulses symmetric and intact bilaterally Pulmonary/Chest: CTAB, no wheezes, rales, or rhonchi Abdominal: Soft. Non-tender, non-distended, bowel sounds are normal, no masses, organomegaly, or guarding present.  GU: no CVA tenderness Musculoskeletal: right foot in cast. Some few laceration noted on the top of the toes . Warm to touch.  Neurological: A&O x3,  no focal motor deficit, sensory intact to light touch bilaterally.  Skin: Warm, dry . No rash, cyanosis, or clubbing.

## 2011-04-11 NOTE — Progress Notes (Deleted)
  Subjective:    Patient ID: [name], male    DOB: March 18, 1956, 55 y.o.   MRN: [id number]  HPI    Review of Systems     Objective:   Physical Exam        Assessment & Plan:

## 2011-04-11 NOTE — Assessment & Plan Note (Signed)
Patient was found to have Achilles tendon rupture in 02/2011 and was placed in a cast. During hospitalization dr Shon Baton and Dr Charlann Boxer from [facility] were consulted who recommended 6-8 of cast but follow up after discharge in 2-4 weeks. For possible changes of the cast and repeat Xray. I informed the facility to make an appointment with the [facility] .

## 2011-04-12 NOTE — Progress Notes (Signed)
Info from 04/11/11 in clinic faxed to [facility] [phone] per Dr Loistine Chance. Stanton Kidney Darvin Dials RN 04/12/11 10:30PM

## 2011-06-12 ENCOUNTER — Encounter: Payer: Self-pay | Admitting: Internal Medicine

## 2011-06-12 ENCOUNTER — Ambulatory Visit (INDEPENDENT_AMBULATORY_CARE_PROVIDER_SITE_OTHER): Payer: Self-pay | Admitting: Internal Medicine

## 2011-06-12 VITALS — BP 134/79 | HR 81 | Temp 97.5°F | Ht 71.0 in | Wt 130.9 lb

## 2011-06-12 DIAGNOSIS — I82409 Acute embolism and thrombosis of unspecified deep veins of unspecified lower extremity: Secondary | ICD-10-CM

## 2011-06-12 NOTE — Progress Notes (Signed)
  Subjective:   Patient ID: [name] male   DOB: 1956/04/17 55 y.o.   MRN: [id number]  HPI: [name] is a 55 y.o. male with PMH significant as outlined below who presented to the clinic for a regular office visit. Patient has no complains today . He is receiving PT on a regular basis. Patient noted that he his INR was checked today  And it was 2.0 per patient.  Patient's boot was removed. Patient can not specify when .  Status at [facility] in [place], [place] .      Past Medical History  Diagnosis Date  . Achilles tendon rupture 01/09/2011  . Radial fracture 11/09/2010  . Polysubstance abuse 04/11/2011  . Hip fracture 08/11/2010  . Urinary retention 04/11/2011  . DVT (deep venous thrombosis) 01/09/2011   Current Outpatient Prescriptions  Medication Sig Dispense Refill  . acetaminophen (TYLENOL) 325 MG tablet Take 650 mg by mouth every 6 (six) hours as needed.        . calcium carbonate 1250 MG capsule Take 1,250 mg by mouth daily.        . Cholecalciferol (VITAMIN D3) 2000 UNITS TABS Take 2,000 Units by mouth.        . folic acid (FOLVITE) 1 MG tablet Take 1 mg by mouth daily.        . Multiple Vitamin (MULTIVITAMIN) tablet Take 1 tablet by mouth daily.        . Tamsulosin HCl (FLOMAX) 0.4 MG CAPS Take 0.4 mg by mouth daily.        Marland Kitchen thiamine 100 MG tablet Take 100 mg by mouth daily.        . vitamin B-12 (CYANOCOBALAMIN) 100 MCG tablet Take 50 mcg by mouth daily.        . vitamin B-12 (CYANOCOBALAMIN) 1000 MCG tablet Take 1,000 mcg by mouth daily.        Marland Kitchen warfarin (COUMADIN) 3 MG tablet Take 3 mg by mouth daily. INR checked at [facility].        Review of Systems: Constitutional: Denies fever, chills, diaphoresis, appetite change and fatigue.    Respiratory: Denies SOB, DOE, cough, chest tightness,  and wheezing.   Cardiovascular: Denies chest pain, palpitations and leg swelling.  Gastrointestinal: Denies nausea, vomiting,  abdominal pain, diarrhea, constipation, blood in stool and abdominal distention.  Genitourinary: Denies dysuria, urgency, frequency, hematuria, flank pain and difficulty urinating.  Skin: Denies pallor, rash and wound.    Objective:  Physical Exam: Filed Vitals:   06/12/11 1322  BP: 134/79  Pulse: 81  Temp: 97.5 F (36.4 C)  TempSrc: Oral  Height: 5\' 11"  (1.803 m)  Weight: 130 lb 14.4 oz (59.376 kg)  SpO2: 98%   Constitutional: Vital signs reviewed.  Patient is a well-developed and well-nourished  in no acute distress and cooperative with exam. Alert and oriented x3.   Neck: Supple,   Cardiovascular: RRR, S1 normal, S2 normal, no MRG, pulses symmetric and intact bilaterally Pulmonary/Chest: CTAB, no wheezes, rales, or rhonchi Abdominal: Soft. Non-tender, non-distended, bowel sounds are normal, GU: no CVA tenderness Musculoskeletal: No joint deformities, erythema, or stiffness, ROM full and no nontender Neurological: A&O x3, no focal motor deficit, Skin: Warm, dry and intact. No rash, cyanosis, or clubbing.

## 2011-07-11 NOTE — Assessment & Plan Note (Signed)
I would recommend Warfarin therapy as long as patient has decreased immobility. Normally the recommendation is if it is an unprovoked DVT and a first time event therapy should be for 3 month. Patient likely did not take any medication in July on a regular basis. Therefore I would start think the starting time is 03/2011 when patient was admitted to the hospital. Since then patient has close monitoring and has been taking Warfarin on a regular basis.

## 2011-10-19 ENCOUNTER — Emergency Department (HOSPITAL_COMMUNITY)
Admission: EM | Admit: 2011-10-19 | Discharge: 2011-10-19 | Disposition: A | Discharge status: Home / Self Care | Payer: Self-pay | Attending: Emergency Medicine | Admitting: Emergency Medicine

## 2011-10-19 ENCOUNTER — Encounter (HOSPITAL_COMMUNITY): Payer: Self-pay | Admitting: *Deleted

## 2011-10-19 ENCOUNTER — Encounter (HOSPITAL_COMMUNITY): Payer: Self-pay

## 2011-10-19 DIAGNOSIS — R112 Nausea with vomiting, unspecified: Secondary | ICD-10-CM | POA: Insufficient documentation

## 2011-10-19 DIAGNOSIS — R1110 Vomiting, unspecified: Secondary | ICD-10-CM | POA: Insufficient documentation

## 2011-10-19 DIAGNOSIS — R29898 Other symptoms and signs involving the musculoskeletal system: Secondary | ICD-10-CM | POA: Insufficient documentation

## 2011-10-19 DIAGNOSIS — F17200 Nicotine dependence, unspecified, uncomplicated: Secondary | ICD-10-CM | POA: Insufficient documentation

## 2011-10-19 LAB — CBC
HCT: 31.9 % — ABNORMAL LOW (ref 39.0–52.0)
Hemoglobin: 10.2 g/dL — ABNORMAL LOW (ref 13.0–17.0)
MCV: 95.2 fL (ref 78.0–100.0)
RBC: 3.35 MIL/uL — ABNORMAL LOW (ref 4.22–5.81)
WBC: 12.2 10*3/uL — ABNORMAL HIGH (ref 4.0–10.5)

## 2011-10-19 LAB — COMPREHENSIVE METABOLIC PANEL
Albumin: 3.6 g/dL (ref 3.5–5.2)
BUN: 24 mg/dL — ABNORMAL HIGH (ref 6–23)
Calcium: 9.2 mg/dL (ref 8.4–10.5)
GFR calc Af Amer: >90 mL/min (ref >90)
Glucose, Bld: 89 mg/dL (ref 70–99)
Total Protein: 7 g/dL (ref 6.0–8.3)

## 2011-10-19 LAB — DIFFERENTIAL
Basophils Absolute: 0 10*3/uL (ref 0.0–0.1)
Lymphocytes Relative: 25 % (ref 12–46)
Lymphs Abs: 3.1 10*3/uL (ref 0.7–4.0)
Monocytes Absolute: 1.2 10*3/uL — ABNORMAL HIGH (ref 0.1–1.0)
Monocytes Relative: 9 % (ref 3–12)
Neutro Abs: 7.8 10*3/uL — ABNORMAL HIGH (ref 1.7–7.7)

## 2011-10-19 LAB — RAPID URINE DRUG SCREEN, HOSP PERFORMED
Amphetamines: NOT DETECTED
Barbiturates: NOT DETECTED
Benzodiazepines: NOT DETECTED
Tetrahydrocannabinol: NOT DETECTED

## 2011-10-19 LAB — URINALYSIS, ROUTINE W REFLEX MICROSCOPIC
Bilirubin Urine: NEGATIVE
Glucose, UA: NEGATIVE mg/dL
Nitrite: NEGATIVE

## 2011-10-19 MED ORDER — SODIUM CHLORIDE 0.9 % IV BOLUS (SEPSIS)
1000.0000 mL | Freq: Once | INTRAVENOUS | Status: AC
  Administered 2011-10-19: 1000 mL via INTRAVENOUS

## 2011-10-19 MED ORDER — ONDANSETRON 4 MG PO TBDP
4.0000 mg | ORAL_TABLET | Freq: Once | ORAL | Status: AC
  Administered 2011-10-19: 4 mg via ORAL
  Filled 2011-10-19: qty 1

## 2011-10-19 MED ORDER — ONDANSETRON 4 MG PO TBDP: 4.0000 mg | ORAL_TABLET | Freq: Three times a day (TID) | ORAL | Status: AC | PRN

## 2011-10-19 NOTE — Discharge Instructions (Signed)
RESOURCE GUIDE  Dental Problems  Patients with Medicaid: [facility]                     [facility] [address]                                           [address] Phone:  [phone]                                                  Phone:  [phone]  If unable to pay or uninsured, contact:  [facility] or [facility] to become qualified for the adult dental clinic.  Chronic Pain Problems Contact [facility]  [phone] Patients need to be referred by their primary care doctor.  Insufficient Money for Medicine Contact [employer]:  call "211" or [facility] 271-5999.  No Primary Care Doctor Call [facility]  832-8000 Other agencies that provide inexpensive medical care    [facility]  [phone]    [facility]  [phone]    [facility]  [phone]    Women's Clinic  [phone]    [facility]  [phone]    [facility]  [phone]  Psychological Services [facility]  [phone] [facility]  [phone] [facility]   800 853-5163 (emergency services 641-4993)  Substance Abuse Resources [facility]  336-882-2125 [facility] 336-784-9470 [facility] 336-285-9073 [facility] 336-845-3988 Residential & Outpatient Substance Abuse Program  800-659-3381  Abuse/Neglect [place] Child Abuse Hotline (336) 641-3795 [place] Child Abuse Hotline 800-378-5315 (After Hours)  Emergency Shelter [facility] (336) 271-5985  M[facility] (336) 275-9566 [facility] (704) 372-4663  MRSA Hotline #:   832-7006    [place] Resources  [facility] [place] [facility]     [employer]                          [facility] [address] [place]                       [address]      [address]  [place]                                                [place]                            [place] Phone:  [phone]                                   Phone:  [phone]                 Phone:  [phone]  [facility] Phone:  [phone]    [facility] Child Abuse Hotline 787-003-0426 (272) 129-4455 (After Hours)  Nausea and Vomiting Nausea is a sick feeling that often comes before throwing up (vomiting). Vomiting is a reflex where stomach contents come out of your mouth. Vomiting can cause severe loss of body fluids (dehydration). Children and elderly adults can become dehydrated quickly, especially if they also have diarrhea. Nausea and vomiting are symptoms of a condition or disease. It is important to find the cause of your symptoms. CAUSES   Direct irritation of the stomach lining. This irritation can result from increased acid production (gastroesophageal reflux disease), infection, food poisoning, taking certain medicines (such as nonsteroidal anti-inflammatory drugs), alcohol use, or tobacco use.   Signals from the brain.These signals could be caused by a headache, heat exposure, an inner ear disturbance, increased pressure in the brain from injury, infection, a tumor, or a concussion, pain, emotional stimulus, or metabolic problems.   An obstruction in the gastrointestinal tract (bowel obstruction).   Illnesses such as diabetes, hepatitis, gallbladder problems, appendicitis, kidney problems, cancer, sepsis, atypical symptoms of a heart attack, or eating disorders.   Medical treatments such as chemotherapy and radiation.   Receiving medicine that makes you sleep (general anesthetic) during surgery.  DIAGNOSIS Your caregiver may ask for tests to be done if the problems do not improve after a few days. Tests may also be done if symptoms are severe or if the reason for the nausea and vomiting is not clear. Tests may include:  Urine  tests.   Blood tests.   Stool tests.   Cultures (to look for evidence of infection).   X-rays or other imaging studies.  Test results can help your caregiver make decisions about treatment or the need for additional tests. TREATMENT You need to stay well hydrated. Drink frequently but in small amounts.You may wish to drink water, sports drinks, clear broth, or eat frozen ice pops or gelatin dessert to help stay hydrated.When you eat, eating slowly may help prevent nausea.There are also some antinausea medicines that may help prevent nausea. HOME CARE INSTRUCTIONS   Take all medicine as directed by your caregiver.   If you do not have an appetite, do not force yourself to eat. However, you must continue to drink fluids.   If you have an appetite, eat a normal diet unless your caregiver tells you differently.   Eat a variety of complex carbohydrates (rice, wheat, potatoes, bread), lean meats, yogurt, fruits, and vegetables.   Avoid high-fat foods because they are more difficult to digest.   Drink enough water and fluids to keep your urine clear or pale yellow.   If you are dehydrated, ask your caregiver for specific rehydration instructions. Signs of dehydration may include:   Severe thirst.   Dry lips and mouth.   Dizziness.   Dark urine.   Decreasing urine frequency and amount.   Confusion.   Rapid breathing or pulse.  SEEK IMMEDIATE MEDICAL CARE IF:   You have blood or brown flecks (like coffee grounds) in your vomit.   You have black or bloody stools.   You have a severe headache or stiff neck.   You are confused.   You have severe abdominal pain.   You have chest pain or trouble breathing.   You do not urinate at least once every 8 hours.   You develop cold or clammy skin.   You continue to vomit for longer than 24 to 48 hours.   You have a fever.  MAKE  SURE YOU:   Understand these instructions.   Will watch your condition.   Will get help  right away if you are not doing well or get worse.  Document Released: 06/19/2005 Document Revised: 06/08/2011 Document Reviewed: 11/16/2010 [facility] Patient Information 2012 [place], [place].

## 2011-10-19 NOTE — ED Provider Notes (Signed)
History     CSN: [id number]  Arrival date & time 10/19/11  1733   First MD Initiated Contact with Patient 10/19/11 1747      Chief Complaint  Patient presents with  . Emesis     Patient is a 56 y.o. male presenting with vomiting. The history is provided by the patient. No language interpreter was used.  Emesis  This is a new problem. The current episode started 12 to 24 hours ago. The problem occurs 2 to 4 times per day. The problem has been gradually improving. The emesis has an appearance of stomach contents. There has been no fever. Pertinent negatives include no abdominal pain, no arthralgias, no chills, no cough, no diarrhea, no fever, no headaches, no myalgias, no sweats and no URI. Risk factors: Alcohol.    Past Medical History  Diagnosis Date  . Achilles tendon rupture 01/09/2011  . Radial fracture 11/09/2010  . Polysubstance abuse 04/11/2011  . Hip fracture 08/11/2010  . Urinary retention 04/11/2011  . DVT (deep venous thrombosis) 01/09/2011    No past surgical history on file.  No family history on file.  History  Substance Use Topics  . Smoking status: Current Everyday Smoker -- 0.5 packs/day    Types: Cigarettes  . Smokeless tobacco: Not on file  . Alcohol Use: Yes     Reports no Alcohol for 1 year      Review of Systems  Constitutional: Negative for fever and chills.  HENT: Negative for neck pain and neck stiffness.   Eyes: Negative for visual disturbance.  Respiratory: Negative for cough, chest tightness and shortness of breath.   Cardiovascular: Negative for chest pain, palpitations and leg swelling.  Gastrointestinal: Positive for nausea and vomiting. Negative for abdominal pain, diarrhea, constipation, blood in stool and abdominal distention.  Genitourinary: Negative for dysuria, urgency, hematuria and difficulty urinating.  Musculoskeletal: Negative for myalgias, back pain, arthralgias and gait problem.  Skin: Negative for rash.  Neurological: Negative  for dizziness, tremors, seizures, syncope, facial asymmetry, speech difficulty, weakness, light-headedness, numbness and headaches.  Hematological: Negative for adenopathy. Does not bruise/bleed easily.  Psychiatric/Behavioral: Negative for confusion.    Allergies  Review of patient's allergies indicates no known allergies.  Home Medications   Current Outpatient Rx  Name Route Sig Dispense Refill  . ACETAMINOPHEN 325 MG PO TABS Oral Take 650 mg by mouth every 6 (six) hours as needed.      Marland Kitchen CALCIUM CARBONATE 1250 MG PO CAPS Oral Take 1,250 mg by mouth daily.      Marland Kitchen VITAMIN D3 2000 UNITS PO TABS Oral Take 2,000 Units by mouth.      Di Kindle SULFATE 325 (65 FE) MG PO TABS Oral Take 325 mg by mouth 2 (two) times daily.      Marland Kitchen FOLIC ACID 1 MG PO TABS Oral Take 1 mg by mouth daily.      Marland Kitchen ONE-DAILY MULTI VITAMINS PO TABS Oral Take 1 tablet by mouth daily.      Marland Kitchen TAMSULOSIN HCL 0.4 MG PO CAPS Oral Take 0.4 mg by mouth daily.      . THIAMINE HCL 100 MG PO TABS Oral Take 100 mg by mouth daily.      Marland Kitchen VITAMIN B-12 1000 MCG PO TABS Oral Take 1,000 mcg by mouth daily.      . WARFARIN SODIUM 3 MG PO TABS Oral Take 2 mg by mouth daily. INR checked at [facility]. Warfarin 2 mg every other day  and 2.5 mg every other day.      BP 147/68  Pulse 89  Temp(Src) 98.7 F (37.1 C) (Oral)  Resp 13  SpO2 98%  Physical Exam  Constitutional: He is oriented to person, place, and time. He appears well-developed and well-nourished. No distress.  HENT:  Head: Normocephalic.  Eyes: Conjunctivae are normal.  Neck: Normal range of motion. Neck supple.  Cardiovascular: Normal rate, regular rhythm, normal heart sounds and intact distal pulses.   No murmur heard. Pulmonary/Chest: Effort normal and breath sounds normal. No respiratory distress. He has no wheezes. He has no rales. He exhibits no tenderness.  Abdominal: Soft. Bowel sounds are normal. He exhibits no distension and no mass. There  is no tenderness. There is no rebound and no guarding.  Musculoskeletal: Normal range of motion. He exhibits no edema and no tenderness.  Neurological: He is alert and oriented to person, place, and time.  Skin: Skin is warm and dry. He is not diaphoretic.  Psychiatric: He has a normal mood and affect.    ED Course  Procedures (including critical care time)  Labs Reviewed  CBC - Abnormal; Notable for the following:    WBC 12.2 (*)    RBC 3.35 (*)    Hemoglobin 10.2 (*)    HCT 31.9 (*)    Platelets 665 (*)    All other components within normal limits  DIFFERENTIAL - Abnormal; Notable for the following:    Neutro Abs 7.8 (*)    Monocytes Absolute 1.2 (*)    All other components within normal limits  LIPASE, BLOOD  COMPREHENSIVE METABOLIC PANEL  URINE RAPID DRUG SCREEN (HOSP PERFORMED)  URINALYSIS, ROUTINE W REFLEX MICROSCOPIC   No results found.   1. Vomiting alone       MDM  Pt is a well appearing 55yo alcoholic male with stable VS brought by EMS for nausea and nonbilious and nonbloody emesis since yesterday. Denies pain, dizziness, syncope, agitation or withdrawal sx. No recent abx, bad food exposure or NSAID use. Last emesis 5 hours ago. No concerning exam findings. No s/s withdrawal. Likely gastritis. Zofran given and pt tolerated PO well. D/C home in stable and improved condition.         Consuello Masse, MD 10/19/11 1610  Consuello Masse, MD 10/19/11 2330

## 2011-10-19 NOTE — Discharge Instructions (Signed)
Use the medicine you were previously prescribed for recurrent vomiting.

## 2011-10-19 NOTE — ED Notes (Signed)
AVW:[id number]<XB> Expected date:10/19/11<BR> Expected time: 9:56 PM<BR> Means of arrival:Ambulance<BR> Comments:<BR> N,v,leg pain

## 2011-10-19 NOTE — ED Notes (Signed)
Patient C/O vomiting that began this AM.  Denies any Nausea, Pain or diarrhea.   EMS reports that patient C/O nausea and lightheadedness and denied vomiting.  Patient was picked up at [facility] house.  EMS reports that patient was seen at [facility] yesterday for the same symptoms.

## 2011-10-19 NOTE — ED Notes (Addendum)
At 2030 MD saw pt and said he was just here and is homeless and gave him a bus pass and placed him into a WC and put him in the lobby  He refused to sign his papers MD and charge know

## 2011-10-19 NOTE — ED Provider Notes (Signed)
History     CSN: [id number]  Arrival date & time 10/19/11  2206   First MD Initiated Contact with Patient 10/19/11 2213      Chief Complaint  Patient presents with  . Emesis  . Nausea  . Extremity Weakness    (Consider location/radiation/quality/duration/timing/severity/associated sxs/prior treatment) Patient is a 56 y.o. male presenting with vomiting. The history is provided by the patient and medical records.  Emesis   the pt is a 60 y male who HAD n/v.  He was just seen at [facility] ER and released.  [place] dep[place] and then [place] here. He DENIES nausea now.  He DENIES pain anywhere.  He is asx now.  He has rx for antiemetic.   Past Medical History  Diagnosis Date  . Achilles tendon rupture 01/09/2011  . Radial fracture 11/09/2010  . Polysubstance abuse 04/11/2011  . Hip fracture 08/11/2010  . Urinary retention 04/11/2011  . DVT (deep venous thrombosis) 01/09/2011    History reviewed. No pertinent past surgical history.  History reviewed. No pertinent family history.  History  Substance Use Topics  . Smoking status: Current Everyday Smoker -- 0.5 packs/day    Types: Cigarettes  . Smokeless tobacco: Not on file  . Alcohol Use: Yes     Reports no Alcohol for 1 year      Review of Systems  Gastrointestinal: Positive for nausea and vomiting.       N/v resolved.  Musculoskeletal: Positive for extremity weakness.  All other systems reviewed and are negative.    Allergies  Review of patient's allergies indicates no known allergies.  Home Medications   Current Outpatient Rx  Name Route Sig Dispense Refill  . ACETAMINOPHEN 325 MG PO TABS Oral Take 650 mg by mouth every 6 (six) hours as needed.      Marland Kitchen CALCIUM CARBONATE 1250 MG PO CAPS Oral Take 1,250 mg by mouth daily.      Marland Kitchen VITAMIN D3 2000 UNITS PO TABS Oral Take 2,000 Units by mouth.      Di Kindle SULFATE 325 (65 FE) MG PO TABS Oral Take 325 mg by mouth 2 (two) times daily.      Marland Kitchen FOLIC ACID 1 MG PO TABS Oral  Take 1 mg by mouth daily.      Marland Kitchen ONE-DAILY MULTI VITAMINS PO TABS Oral Take 1 tablet by mouth daily.      Marland Kitchen ONDANSETRON 4 MG PO TBDP Oral Take 1 tablet (4 mg total) by mouth every 8 (eight) hours as needed for nausea. 10 tablet 0  . TAMSULOSIN HCL 0.4 MG PO CAPS Oral Take 0.4 mg by mouth daily.      . THIAMINE HCL 100 MG PO TABS Oral Take 100 mg by mouth daily.      Marland Kitchen VITAMIN B-12 1000 MCG PO TABS Oral Take 1,000 mcg by mouth daily.      . WARFARIN SODIUM 3 MG PO TABS Oral Take 2 mg by mouth daily. INR checked at [facility]. Warfarin 2 mg every other day and 2.5 mg every other day.      BP 126/62  Pulse 82  Temp(Src) 98 F (36.7 C) (Oral)  Resp 18  SpO2 100%  Physical Exam  Vitals reviewed. Constitutional: He is oriented to person, place, and time. He appears well-developed and well-nourished. No distress.  HENT:  Head: Normocephalic and atraumatic.  Eyes: Conjunctivae are normal.  Neck: Normal range of motion.  Pulmonary/Chest: Effort normal.  Abdominal:  He exhibits distension.  Musculoskeletal: Normal range of motion.  Neurological: He is alert and oriented to person, place, and time.  Skin: Skin is warm and dry.  Psychiatric: Thought content normal.       Flat affect    ED Course  Procedures (including critical care time) N/v resolved No acute illness.  asx now.   Labs Reviewed - No data to display No results found.   No diagnosis found.    MDM  Vomiting Resolved.        Cheri Guppy, MD 10/19/11 2230

## 2011-10-19 NOTE — ED Notes (Signed)
Pt was seen at [facility] earlier today for the same complaint, discharged with zofran, script not filled yet. [facility] picked [place] bus dep[place] with the same complaint.

## 2011-10-20 ENCOUNTER — Inpatient Hospital Stay (HOSPITAL_COMMUNITY)
Admission: EM | Admit: 2011-10-20 | Discharge: 2011-10-27 | DRG: 194 | Disposition: A | Discharge status: Home / Self Care | Payer: Self-pay | Attending: Internal Medicine | Admitting: Internal Medicine

## 2011-10-20 ENCOUNTER — Emergency Department (HOSPITAL_COMMUNITY): Payer: Self-pay

## 2011-10-20 ENCOUNTER — Encounter (HOSPITAL_COMMUNITY): Payer: Self-pay | Admitting: Emergency Medicine

## 2011-10-20 DIAGNOSIS — R531 Weakness: Secondary | ICD-10-CM

## 2011-10-20 DIAGNOSIS — R5381 Other malaise: Secondary | ICD-10-CM | POA: Diagnosis present

## 2011-10-20 DIAGNOSIS — I82409 Acute embolism and thrombosis of unspecified deep veins of unspecified lower extremity: Secondary | ICD-10-CM

## 2011-10-20 DIAGNOSIS — F1020 Alcohol dependence, uncomplicated: Secondary | ICD-10-CM | POA: Diagnosis present

## 2011-10-20 DIAGNOSIS — F1027 Alcohol dependence with alcohol-induced persisting dementia: Secondary | ICD-10-CM | POA: Diagnosis present

## 2011-10-20 DIAGNOSIS — F17200 Nicotine dependence, unspecified, uncomplicated: Secondary | ICD-10-CM | POA: Diagnosis present

## 2011-10-20 DIAGNOSIS — F200 Paranoid schizophrenia: Secondary | ICD-10-CM | POA: Diagnosis present

## 2011-10-20 DIAGNOSIS — S86019A Strain of unspecified Achilles tendon, initial encounter: Secondary | ICD-10-CM

## 2011-10-20 DIAGNOSIS — R339 Retention of urine, unspecified: Secondary | ICD-10-CM

## 2011-10-20 DIAGNOSIS — Z86718 Personal history of other venous thrombosis and embolism: Secondary | ICD-10-CM

## 2011-10-20 DIAGNOSIS — M7989 Other specified soft tissue disorders: Secondary | ICD-10-CM | POA: Diagnosis present

## 2011-10-20 DIAGNOSIS — J189 Pneumonia, unspecified organism: Principal | ICD-10-CM | POA: Diagnosis present

## 2011-10-20 DIAGNOSIS — F0280 Dementia in other diseases classified elsewhere without behavioral disturbance: Secondary | ICD-10-CM | POA: Diagnosis present

## 2011-10-20 DIAGNOSIS — F1910 Other psychoactive substance abuse, uncomplicated: Secondary | ICD-10-CM | POA: Diagnosis present

## 2011-10-20 DIAGNOSIS — S5290XA Unspecified fracture of unspecified forearm, initial encounter for closed fracture: Secondary | ICD-10-CM

## 2011-10-20 DIAGNOSIS — S72009A Fracture of unspecified part of neck of unspecified femur, initial encounter for closed fracture: Secondary | ICD-10-CM

## 2011-10-20 HISTORY — DX: Paranoid schizophrenia: F20.0

## 2011-10-20 LAB — CBC
Hemoglobin: 10.5 g/dL — ABNORMAL LOW (ref 13.0–17.0)
MCH: 30.4 pg (ref 26.0–34.0)
RBC: 3.45 MIL/uL — ABNORMAL LOW (ref 4.22–5.81)
WBC: 14.8 10*3/uL — ABNORMAL HIGH (ref 4.0–10.5)

## 2011-10-20 LAB — URINALYSIS, ROUTINE W REFLEX MICROSCOPIC
Bilirubin Urine: NEGATIVE
Hgb urine dipstick: NEGATIVE
Ketones, ur: NEGATIVE mg/dL
Nitrite: NEGATIVE
Protein, ur: NEGATIVE mg/dL
Specific Gravity, Urine: 1.011 (ref 1.005–1.030)
Urobilinogen, UA: 1 mg/dL (ref 0.0–1.0)

## 2011-10-20 LAB — COMPREHENSIVE METABOLIC PANEL
ALT: 14 U/L (ref 0–53)
AST: 16 U/L (ref 0–37)
Alkaline Phosphatase: 118 U/L — ABNORMAL HIGH (ref 39–117)
CO2: 23 mEq/L (ref 19–32)
Calcium: 9.4 mg/dL (ref 8.4–10.5)
Chloride: 100 mEq/L (ref 96–112)
GFR calc Af Amer: >90 mL/min (ref >90)
GFR calc non Af Amer: >90 mL/min (ref >90)
Glucose, Bld: 84 mg/dL (ref 70–99)
Sodium: 135 mEq/L (ref 135–145)
Total Bilirubin: 0.4 mg/dL (ref 0.3–1.2)

## 2011-10-20 LAB — RAPID URINE DRUG SCREEN, HOSP PERFORMED
Barbiturates: NOT DETECTED
Tetrahydrocannabinol: NOT DETECTED

## 2011-10-20 LAB — ETHANOL: Alcohol, Ethyl (B): <11 mg/dL (ref 0–11)

## 2011-10-20 MED ORDER — LORAZEPAM 1 MG PO TABS: 1.0000 mg | ORAL_TABLET | Freq: Four times a day (QID) | ORAL | Status: DC | PRN

## 2011-10-20 MED ORDER — PANTOPRAZOLE SODIUM 40 MG PO TBEC
40.0000 mg | DELAYED_RELEASE_TABLET | Freq: Every day | ORAL | Status: DC
  Administered 2011-10-21 – 2011-10-26 (×6): 40 mg via ORAL
  Filled 2011-10-20 (×8): qty 1

## 2011-10-20 MED ORDER — MOXIFLOXACIN HCL IN NACL 400 MG/250ML IV SOLN
400.0000 mg | INTRAVENOUS | Status: DC
  Administered 2011-10-21: 400 mg via INTRAVENOUS
  Filled 2011-10-20 (×2): qty 250

## 2011-10-20 MED ORDER — RISPERIDONE 2 MG PO TABS
3.0000 mg | ORAL_TABLET | Freq: Every day | ORAL | Status: DC
  Administered 2011-10-20 – 2011-10-26 (×7): 3 mg via ORAL
  Filled 2011-10-20 (×8): qty 1

## 2011-10-20 MED ORDER — CITALOPRAM HYDROBROMIDE 20 MG PO TABS
20.0000 mg | ORAL_TABLET | Freq: Every day | ORAL | Status: DC
  Administered 2011-10-21 – 2011-10-27 (×7): 20 mg via ORAL
  Filled 2011-10-20 (×7): qty 1

## 2011-10-20 MED ORDER — ENOXAPARIN SODIUM 40 MG/0.4ML ~~LOC~~ SOLN
40.0000 mg | SUBCUTANEOUS | Status: DC
  Administered 2011-10-20 – 2011-10-26 (×7): 40 mg via SUBCUTANEOUS
  Filled 2011-10-20 (×8): qty 0.4

## 2011-10-20 MED ORDER — MOXIFLOXACIN HCL IN NACL 400 MG/250ML IV SOLN
400.0000 mg | Freq: Once | INTRAVENOUS | Status: AC
  Administered 2011-10-20: 400 mg via INTRAVENOUS
  Filled 2011-10-20: qty 250

## 2011-10-20 MED ORDER — SODIUM CHLORIDE 0.9 % IV SOLN
INTRAVENOUS | Status: DC
  Administered 2011-10-20 – 2011-10-22 (×2): via INTRAVENOUS

## 2011-10-20 NOTE — Progress Notes (Signed)
CSW recvd call from [facility] reporting [facility] is refusing to accept patient. CSW called Boneta Lucks, staff at [facility] requesting information on the accuracy of this report. Boneta Lucks advised the [facility] staff stated that patient is incontinent of bowel and bladder and extremely weak. CSW spoke with charge nurse who reported this is false, and patient is continent and despite unsteady gait, can still ambulate well.  Expressed this to Boneta Lucks who advised there are no beds in the [place] shelters, but she have [facility] transport patient to [facility] in [place] and call them to make them aware.  CSW offered additional assistance which Boneta Lucks expressed appreciation for but advised she will take care of since patient was already there.    Manson Passey Arlean Thies ANN S , MSW, LCSWA 10/20/2011 1:46 PM [phone]

## 2011-10-20 NOTE — ED Notes (Signed)
Talked to social work. Found placement for pt.  To give taxi voucher to pt to get to facility.

## 2011-10-20 NOTE — Progress Notes (Signed)
ED CSW spoke with [facility] who reported that patient can come without ID, but must get ID within 7 days.  This Clinical research associate spoke with Chief Executive Officer who agreed to approve $44.00 cab fare as quoted by [employer].  This Clinical research associate provided patient's nurse with cab voucher and directions (including phone number, address, and the time he must arrive to shelter).  ED CSW will remain available for questions as needed.  Manson Passey Taven Strite ANN S , MSW, LCSWA 10/20/2011 5:07 PM [phone]

## 2011-10-20 NOTE — Progress Notes (Signed)
CSW called [facility] to identify how patient arrived at their facility and the circumstances surrounding patient being bought to the Emergency Department.  Staff at [facility] reported patient was sent to them in a cab from [facility]. They did not have any beds available there and he asked them to call the ambulance so he could be bought to the hospital, which they did.  [facility] reported they do not have beds available and believe they are not able to meet his needs due to the extensive assistance he needs in ambulation. CSW called [facility] in [place] and there are no beds available either.  CSW spoke with patient to assess for needs and to obtain background information. CSW asked patient to recall the purpose for relocating to this area. Pt reported he believed the area would provide him with something else than what it has.  Pt then began speaking of his parents who evidently live in the area. CSW asked pt if I had permission to call his parents to see if he can stay with them until he became more mobile and gained strength.  Also reported to patient that the hospital paid for his stay at [facility], however, during conversation with administration there, he did not qualify for Medicaid.  Pt advised that he does not think it would do any good, but he did not mind.  CSW called the [place]' residence at [phone] and left message requesting return phone call to [phone] which is phone number for CSW.  CSW will continue to work on contacting his parents.    Manson Passey Wai Minotti ANN S , MSW, LCSWA 10/20/2011  3:52 PM   [phone]

## 2011-10-20 NOTE — Progress Notes (Signed)
ED Charge RN inquired about pt concerns Self pay with family that has been reported not to be supportive  He has been to [facility] prior to coming to [facility] on 10/19/11 Pt D/c from each [facility] facility without noted admission needs per EPIC.  Cm noted pt had been d/c to [facility] on 03/11/11 from [facility] Referred to ED SW

## 2011-10-20 NOTE — H&P (Signed)
PCP[facility]  Chief Complaint:   weakness  HPI: [name] is a 56 y.o. male   has a past medical history of Achilles tendon rupture (01/09/2011); Radial fracture (11/09/2010); Polysubstance abuse (04/11/2011); Hip fracture (08/11/2010); Urinary retention (04/11/2011); DVT (deep venous thrombosis) (01/09/2011); and Paranoid schizophrenia.   Presented with  Nausea and vomiting since 2 days and generalized weakness. States so weak could not get up to use the bathroom.  He is not quite sure why he is here just states he is alive and ticking... Denies fever and chills no chest pain does indorse some cough. He had a history of DVT in the past (JAN 2013)not sure if finished the course of his coumadin. He had some Right leg swelling maybe for some months.  Review of Systems:    Pertinent positives include: nausea, vomiting, non-productive cough,  Constitutional:  No weight loss, night sweats, Fevers, chills, fatigue, weight loss  HEENT:  No headaches, Difficulty swallowing,Tooth/dental problems,Sore throat,  No sneezing, itching, ear ache, nasal congestion, post nasal drip,  Cardio-vascular:  No chest pain, Orthopnea, PND, anasarca, dizziness, palpitations.no Bilateral lower extremity swelling  GI:  No heartburn, indigestion, abdominal pain, diarrhea, change in bowel habits, loss of appetite, melena, blood in stool, hematemesis Resp:  no shortness of breath at rest. No dyspnea on exertion, No excess mucus, no productive cough, No  No coughing up of blood.No change in color of mucus.No wheezing. Skin:  no rash or lesions. No jaundice GU:  no dysuria, change in color of urine, no urgency or frequency. No straining to urinate.  No flank pain.  Musculoskeletal:  No joint pain or no joint swelling. No decreased range of motion. No back pain.  Psych:  No change in mood or affect. No depression or anxiety. No memory loss.  Neuro: no localizing neurological complaints, no tingling,  no weakness, no double vision, no gait abnormality, no slurred speech, no confusion  Otherwise ROS are negative except for above, 10 systems were reviewed  Past Medical History: Past Medical History  Diagnosis Date  . Achilles tendon rupture 01/09/2011  . Radial fracture 11/09/2010  . Polysubstance abuse 04/11/2011  . Hip fracture 08/11/2010  . Urinary retention 04/11/2011  . DVT (deep venous thrombosis) 01/09/2011    right leg per pt  . Paranoid schizophrenia    Past Surgical History  Procedure Date  . Hip surgery     right hip     Medications: Prior to Admission medications   Medication Sig Start Date End Date Taking? Authorizing Provider  acetaminophen (TYLENOL) 325 MG tablet Take 650 mg by mouth every 6 (six) hours as needed. For pain relief   Yes Historical Provider, MD  citalopram (CELEXA) 20 MG tablet Take 20 mg by mouth daily.   Yes Historical Provider, MD  ondansetron (ZOFRAN ODT) 4 MG disintegrating tablet Take 1 tablet (4 mg total) by mouth every 8 (eight) hours as needed for nausea. 10/19/11 10/26/11 Yes Consuello Masse, MD  risperiDONE (RISPERDAL) 3 MG tablet Take 3 mg by mouth at bedtime.   Yes Historical Provider, MD    Allergies:  No Known Allergies  Social History:  Ambulatory with cane Lives with step-daughter   reports that he has been smoking Cigarettes.  He has a 14 pack-year smoking history. He has never used smokeless tobacco. He reports that he drinks alcohol. He reports that he uses illicit drugs (Cocaine, Methamphetamines, and Marijuana).   Family History: family history is not on  file.    Physical Exam: Patient Vitals for the past 24 hrs:  BP Temp Temp src Pulse Resp SpO2  10/20/11 1848 128/75 mmHg - - 92  - -  10/20/11 1846 137/70 mmHg - - 87  - -  10/20/11 1845 130/65 mmHg - - 82  - -  10/20/11 1541 132/80 mmHg 98.2 F (36.8 C) Tympanic 82  18  100 %    1. General:  in No Acute distress 2. Psychological: Alert and Oriented,  but slow to answer,  disheveled 3. Head/ENT:  Dry Mucous Membranes                          Head Non traumatic, neck supple                           Poor Dentition 4. SKIN:  decreased Skin turgor,  Skin clean Dry and intact no rash 5. Heart: Regular rate and rhythm no Murmur, Rub or gallop 6. Lungs: Clear to auscultation bilaterally, no wheezes or crackles   7. Abdomen: Soft, non-tender, Non distended 8. Lower extremities: no clubbing, cyanosis, 1 + edema on the right 9. Neurologically Grossly intact, moving all 4 extremities equally,  couldn't walk and has diffuse weakness and low extremities. 10. MSK: Normal range of motion  body mass index is unknown because there is no height or weight on file.   Labs on Admission:   [facility] 10/20/11 1735 10/19/11 1754  NA 135 138  K 3.6 3.9  CL 100 103  CO2 23 22  GLUCOSE 84 89  BUN 19 24*  CREATININE 0.84 0.87  CALCIUM 9.4 9.2  MG -- --  PHOS -- --    Basename 10/20/11 1735 10/19/11 1754  AST 16 15  ALT 14 15  ALKPHOS 118* 115  BILITOT 0.4 0.2*  PROT 7.4 7.0  ALBUMIN 3.7 3.6    Basename 10/19/11 1754  LIPASE 48  AMYLASE --    Basename 10/20/11 1735 10/19/11 1754  WBC 14.8* 12.2*  NEUTROABS -- 7.8*  HGB 10.5* 10.2*  HCT 32.9* 31.9*  MCV 95.4 95.2  PLT 749* 665*   No results found for this basename: CKTOTAL:3,CKMB:3,CKMBINDEX:3,TROPONINI:3 in the last 72 hours No results found for this basename: TSH,T4TOTAL,FREET3,T3FREE,THYROIDAB in the last 72 hours No results found for this basename: VITAMINB12:2,FOLATE:2,FERRITIN:2,TIBC:2,IRON:2,RETICCTPCT:2 in the last 72 hours Lab Results  Component Value Date   HGBA1C 5.7* 01/02/2011    The CrCl is unknown because both a height and weight (above a minimum accepted value) are required for this calculation. ABG    Component Value Date/Time   TCO2 25 03/03/2011 1118     No results found for this basename: DDIMER     Other results: Urine toxicity screen negative UA no evidence of  infection   Cultures:    Component Value Date/Time   SDES URINE, CLEAN CATCH 01/20/2011 2054   SPECREQUEST IMMUNE:NORM UT SYMPT:NEG 01/20/2011 2054   CULT ENTEROBACTER AEROGENES 01/20/2011 2054   REPTSTATUS 01/23/2011 FINAL 01/20/2011 2054       Radiological Exams on Admission: Dg Chest 2 View  10/20/2011  *RADIOLOGY REPORT*  Clinical Data: Cough and weakness.  CHEST - 2 VIEW  Comparison: Plain films of the chest 01/20/2011, 11/02/2010 and 08/06/2010.  Findings: The lungs are emphysematous with unchanged left basilar scarring. There is some increased opacity in the left lung base worrisome for superimposed pneumonia.  Right lung is  clear.  No pneumothorax or pleural effusion.  Heart size is normal.  IMPRESSION: Emphysema and left basilar scar. Increased opacity in the left lung base is worrisome for superimposed pneumonia.  Recommend follow-up plain films to clearing.  Original Report Authenticated By: Bernadene Bell. Maricela Curet, M.D.    Assessment/Plan  This is a 56 year old gentleman with history of alcohol abuse in the past here with community-acquired pneumonia.   Present on Admission:  .Community acquired pneumonia - will start on Avelox patient appears to be nontoxic.  if does not improve consider broadening.  would need to repeat imaging to make sure this clears, would obtain HIV screening .Polysubstance abuse - patient denied any recent alcohol or substance abuse states he hasn't had any alcohol for the past 3-4 weeks. He is appears to be just slightly tremorous and is a poor historian would watch for signs of withdrawal and give Ativan when necessary  .Debility - would need PT/OT evaluation Right leg swelling - patient had DVT involving that leg is not sure if this is residual after the DVT this patient cannot provide any history concerning this,  will repeat Dopplers  Prophylaxis:  Lovenox, Protonix    I have spent a total of 55 min on this admission  Elise Knobloch 10/20/2011,  7:29 PM

## 2011-10-20 NOTE — ED Provider Notes (Signed)
Medical screening examination/treatment/procedure(s) were performed by non-physician practitioner and as supervising physician I was immediately available for consultation/collaboration.  Joshoa Shawler K Linker, MD 10/20/11 2359 

## 2011-10-20 NOTE — Progress Notes (Signed)
ED SW and ED Cm consulted about possible DME Pt self pay and was last [facility] In July 2012 [facility] medication assistance provided in 01/2011 During 07/12 pt informed [facility] Cm staff he had been homeless for 30 years and noted to refused shelter placement in 2012 more than once during admissions.

## 2011-10-20 NOTE — ED Notes (Signed)
Pt seen here yesterday.  Pt left in wheelchair, but did not get on bus b/c unable to walk.  [facility] was called to transport back to homeless shelter, but homeless shelter refused to accept pt due to weakness.  Was sent to another facility by [facility].  EMS called becuause of pt's weakness and 2nd facility unable to provide care.  Pt incontinent of urine.

## 2011-10-20 NOTE — ED Notes (Signed)
CSW was informed by pt's RN that pt is not ambulatory at this time and therefore can not be discharged to the [facility]. Pt is also unable to return to the [facility] due to no bed availability.   CSW spoke with [facility] ED PA, regarding pt's situation. CSW explained that pt will be unable to go to the shelter in [place] if the pt is not discharged by 6:00pm due to time restrictions given by the shelter. CSW also explained that the pt will not be able to be placed in an ALF from the ED due to pt not qualifying for Medicaid and not having an alternative pay source. CSW has previously verified this information with CSW Director, [employer]. [facility] ED PA has verbalized understanding of the difficulties in placing the pt and the fact that he will be unable to be placed from the ED this evening.   CSW has since received a phone call from pt's father [phone]) who states that he and his wife are elderly and are unable to provide any assistance to the pt. Per father, pt has a previous mental health diagnosis of Paranoid Schizophrenic since the age of 7. Father states that the pt is "too much for him to handle and has alienated the entire family." He adds that the pt has "chosen to live on the streets for the past 3 years and is unable to return to [employer]." Father shared that the pt has been passed from hospitals all across the state until he was placed in [facility] though he is unable to return. He also stated that the pt was just seen at [facility] in the psychiatric unit before he came back to the shelter in [place]. Pt family is unable to assist the pt at this time.

## 2011-10-20 NOTE — ED Notes (Signed)
MD at bedside. Hospitalist at bedside. 

## 2011-10-20 NOTE — ED Provider Notes (Signed)
I saw and evaluated the patient, reviewed the resident's note and I agree with the findings and plan.   .Face to face Exam:  General:  Awake HEENT:  Atraumatic Resp:  Normal effort Abd:  Nondistended Neuro:No focal weakness Lymph: No adenopathy   Nelia Shi, MD 10/20/11 2043

## 2011-10-20 NOTE — ED Provider Notes (Signed)
History     CSN: [id number]  Arrival date & time 10/20/11  1533   First MD Initiated Contact with Patient 10/20/11 1631      Chief Complaint  Patient presents with  . Weakness    (Consider location/radiation/quality/duration/timing/severity/associated sxs/prior treatment) The history is provided by the patient and the EMS personnel.  56 y/o M with hx of paranoid schizophrenia, malingering, and homelessness presents to ED with c/c of weakness. Nursing notes describe an inability to walk, which resulted in the homeless shelter refusing to allow him to stay. Pt tells me he does not know why he is in the ED. He does relate 2 visits yesterday for nausea and vomiting which have subsided. He endorses generalized weakness for the last few days and describes an inability to walk more than a few steps. Also describes a wet but non-productive cough for the last several weeks, unchanged and not assoc with fever, chills, nasal congestion, sore throat, chest pain, or shortness of breath. Denies any back pain/injury, incontinence, or LE numbness. Nursing note describes pt has been incontinent of urine, he reports this is secondary to an inability to get to the bathroom. Denies any known aggravating or alleviating factors.   Past Medical History  Diagnosis Date  . Achilles tendon rupture 01/09/2011  . Radial fracture 11/09/2010  . Polysubstance abuse 04/11/2011  . Hip fracture 08/11/2010  . Urinary retention 04/11/2011  . DVT (deep venous thrombosis) 01/09/2011    History reviewed. No pertinent past surgical history.  History reviewed. No pertinent family history.  History  Substance Use Topics  . Smoking status: Current Everyday Smoker -- 0.5 packs/day    Types: Cigarettes  . Smokeless tobacco: Not on file  . Alcohol Use: Yes     Reports no Alcohol for 1 year      Review of Systems 10 systems reviewed and are negative for acute change except as noted in the HPI.  Allergies  Review of patient's  allergies indicates no known allergies.  Home Medications   Current Outpatient Rx  Name Route Sig Dispense Refill  . ACETAMINOPHEN 325 MG PO TABS Oral Take 650 mg by mouth every 6 (six) hours as needed. For pain relief    . CITALOPRAM HYDROBROMIDE 20 MG PO TABS Oral Take 20 mg by mouth daily.    Marland Kitchen ONDANSETRON 4 MG PO TBDP Oral Take 1 tablet (4 mg total) by mouth every 8 (eight) hours as needed for nausea. 10 tablet 0  . RISPERIDONE 3 MG PO TABS Oral Take 3 mg by mouth at bedtime.      BP 132/80  Pulse 82  Temp(Src) 98.2 F (36.8 C) (Tympanic)  Resp 18  SpO2 100%  Physical Exam  Constitutional: He is oriented to person, place, and time.       VS reviewed, nml. Pt appears thin, well-developed. Poor hygiene. No distress.  HENT:  Head: Normocephalic and atraumatic.  Right Ear: External ear normal.  Left Ear: External ear normal.  Nose: Nose normal.  Mouth/Throat: Oropharynx is clear and moist.       Poor dentition  Eyes: Pupils are equal, round, and reactive to light. Right eye exhibits no discharge. Left eye exhibits no discharge.  Neck: Normal range of motion. Neck supple.  Cardiovascular: Normal rate, regular rhythm and normal heart sounds.   Pulmonary/Chest: Effort normal and breath sounds normal. No respiratory distress. He has no wheezes. He exhibits no tenderness.  Abdominal: Soft. Bowel sounds are normal. He exhibits no distension.  There is no tenderness.  Neurological: He is alert and oriented to person, place, and time. He displays no tremor. No cranial nerve deficit. Sensory deficit: Finger to nose intact bilaterally. He displays no seizure activity. GCS eye subscore is 4. GCS verbal subscore is 5. GCS motor subscore is 6.       Sensation intact to light touch and symmetric in distal BLE. Bilateral grip strength 5/5 and symmetric. Left hip flex/ext 5/5. Right hip flex/ext 4+/5. Bilateral foot plantar and dorsi flexion 5/5 and symmetric. Pt does not cooperate with gait  testing, relates that he is afraid to fall and does not feel strong enough to take a step.  Skin: Skin is warm and dry. No rash noted.  Psychiatric: His speech is delayed.       Flat affect    ED Course  Procedures (including critical care time)  Labs Reviewed  CBC - Abnormal; Notable for the following:    WBC 14.8 (*)    RBC 3.45 (*)    Hemoglobin 10.5 (*)    HCT 32.9 (*)    Platelets 749 (*)    All other components within normal limits  COMPREHENSIVE METABOLIC PANEL - Abnormal; Notable for the following:    Alkaline Phosphatase 118 (*)    All other components within normal limits  URINALYSIS, ROUTINE W REFLEX MICROSCOPIC  URINE RAPID DRUG SCREEN (HOSP PERFORMED)  ETHANOL   Dg Chest 2 View  10/20/2011  *RADIOLOGY REPORT*  Clinical Data: Cough and weakness.  CHEST - 2 VIEW  Comparison: Plain films of the chest 01/20/2011, 11/02/2010 and 08/06/2010.  Findings: The lungs are emphysematous with unchanged left basilar scarring. There is some increased opacity in the left lung base worrisome for superimposed pneumonia.  Right lung is clear.  No pneumothorax or pleural effusion.  Heart size is normal.  IMPRESSION: Emphysema and left basilar scar. Increased opacity in the left lung base is worrisome for superimposed pneumonia.  Recommend follow-up plain films to clearing.  Original Report Authenticated By: Bernadene Bell. D'ALESSIO, M.D.     Dx 1: CAP Dx 2: Weakness   MDM  Labs reviewed, sig for leukocytosis. CXR reviewed, opacity concerning for LLL pneumonia. Abx initiated. [facility] hospitalist has been consulted for admission as inability to ambulate and lack of resources makes pt a poor candidate for outpatient therapy.       Shaaron Adler, PA-C 10/20/11 2005

## 2011-10-20 NOTE — ED Notes (Signed)
CSW contacted [employer] [facility] [place] and spoke with the Nature conservation officer regarding possible shelter placement for pt. Per Nature conservation officer, pt is able to be accepted to the night shelter without an I.D. If he is agreeable to get an ID within 1 week of staying at the shelter. Operations Manager states that he does not need any documentation from the hospital at this time though states the pt must be there before 7pm. ED CSW, Mindi Junker, has secured a cab voucher for the pt and a cane to assist with walking.   CSW has contacted [employer]ar[employer] manager and ED PA Judeth Cornfield regarding pt's disposition. At this time, RN and RN Jeremy Johann is working with pt to make sure that he is ambulatory.   CSW will continue to follow.

## 2011-10-21 DIAGNOSIS — M7989 Other specified soft tissue disorders: Secondary | ICD-10-CM

## 2011-10-21 LAB — COMPREHENSIVE METABOLIC PANEL
BUN: 17 mg/dL (ref 6–23)
Calcium: 8.7 mg/dL (ref 8.4–10.5)
GFR calc Af Amer: >90 mL/min (ref >90)
Glucose, Bld: 96 mg/dL (ref 70–99)
Sodium: 136 mEq/L (ref 135–145)
Total Protein: 6.7 g/dL (ref 6.0–8.3)

## 2011-10-21 LAB — DIFFERENTIAL
Basophils Relative: 0 % (ref 0–1)
Eosinophils Relative: 3 % (ref 0–5)
Lymphocytes Relative: 26 % (ref 12–46)
Monocytes Relative: 9 % (ref 3–12)
Neutro Abs: 6.4 10*3/uL (ref 1.7–7.7)

## 2011-10-21 LAB — CBC
HCT: 32.3 % — ABNORMAL LOW (ref 39.0–52.0)
Hemoglobin: 10.2 g/dL — ABNORMAL LOW (ref 13.0–17.0)
WBC: 10.3 10*3/uL (ref 4.0–10.5)

## 2011-10-21 MED ORDER — ADULT MULTIVITAMIN W/MINERALS CH
1.0000 | ORAL_TABLET | Freq: Every day | ORAL | Status: DC
  Administered 2011-10-21 – 2011-10-27 (×7): 1 via ORAL
  Filled 2011-10-21 (×7): qty 1

## 2011-10-21 NOTE — Progress Notes (Signed)
Subjective: [name] is without any complaints. He feels fine today.  Objective: Weight change:   Intake/Output Summary (Last 24 hours) at 10/21/11 1230 Last data filed at 10/21/11 1610  Gross per 24 hour  Intake    475 ml  Output   1275 ml  Net   -800 ml    Filed Vitals:   10/21/11 0947  BP: 123/67  Pulse: 73  Temp: 97.8 F (36.6 C)  Resp: 16   General: Patient appears his stated age no acute distress. Cardiovascular: Regular rate rhythm Lungs: Clear bilaterally no wheezes rhonchus or rails Abdomen: Soft nontender nondistended positive bowel sounds Extremities: 1+ edema right lower extremity.  Lab Results: Reviewed  Studies/Results: Dg Chest 2 View  10/20/2011  *RADIOLOGY REPORT*  Clinical Data: Cough and weakness.  CHEST - 2 VIEW  Comparison: Plain films of the chest 01/20/2011, 11/02/2010 and 08/06/2010.  Findings: The lungs are emphysematous with unchanged left basilar scarring. There is some increased opacity in the left lung base worrisome for superimposed pneumonia.  Right lung is clear.  No pneumothorax or pleural effusion.  Heart size is normal.  IMPRESSION: Emphysema and left basilar scar. Increased opacity in the left lung base is worrisome for superimposed pneumonia.  Recommend follow-up plain films to clearing.  Original Report Authenticated By: Bernadene Bell. Maricela Curet, M.D.   Medications: Scheduled Meds:   . citalopram  20 mg Oral Daily  . enoxaparin  40 mg Subcutaneous Q24H  . moxifloxacin  400 mg Intravenous Once  . moxifloxacin  400 mg Intravenous Q24H  . pantoprazole  40 mg Oral Q1200  . risperiDONE  3 mg Oral QHS   Continuous Infusions:   . sodium chloride 75 mL/hr at 10/21/11 1040   PRN Meds:.LORazepam  Assessment/Plan: Community acquired pneumonia (10/20/2011) Continue Avelox   Polysubstance abuse (04/11/2011)  watch for signs of withdrawal, when necessary Ativan  Debility (10/20/2011)  physical therapy evaluation   History of DVT (deep  vein thrombosis) (10/20/2011)  lower extremity Doppler negative   LOS: 1 day   [name] 10/21/2011, 12:30 PM

## 2011-10-21 NOTE — Progress Notes (Signed)
Pt refused bed alarm. Will leave door open to monitor. [facility] 10/21/2011 7:53 AM

## 2011-10-21 NOTE — Progress Notes (Signed)
VASCULAR LAB PRELIMINARY  PRELIMINARY  PRELIMINARY  PRELIMINARY  Bilateral lower extremity venous Dopplers completed.    Preliminary report:  There is no DVT or SVT noted in the bilateral lower extremities.  Sherren Kerns [place], 10/21/2011, 9:51 AM

## 2011-10-21 NOTE — Evaluation (Signed)
Physical Therapy Evaluation Patient Details Name: [name] MRN: [id number] DOB: [birth year] Today's Date: 10/21/2011 Time: 7829-5621 PT Time Calculation (min): 21 min  PT Assessment / Plan / Recommendation Clinical Impression  Pt presents with [facility] PNA with history of paranoid schizoprenia and polysubstance abuse.  Pt was homeless and per RN was not ambulating very much.  Unable to stand longer than 30 secs before needing to sit due to severe weakness and pt c/o "droopiness."  Pts BP was stable throughout.  Pt will benefit from skilled PT in acute venue to address deficits.  Unsure of D/C plans at this time due to pt unable to obtain medicaid, however PT recommends some level of therapy at D/C (HHPT at ALF vs SNF).      PT Assessment  Patient needs continued PT services    Follow Up Recommendations  Home health PT;Skilled nursing facility    Equipment Recommendations  None recommended by PT (none recommended if pt has RW)    Frequency Min 3X/week    Precautions / Restrictions Precautions Precautions: Fall Restrictions Weight Bearing Restrictions: No   Pertinent Vitals/Pain No pain      Mobility  Bed Mobility Bed Mobility: Sit to Supine;Supine to Sit;Scooting to HOB Supine to Sit: 5: Supervision;HOB elevated Sit to Supine: HOB elevated;4: Min assist Scooting to [facility]: 3: Mod assist;4: Min assist Details for Bed Mobility Assistance: Supervision  for supine to sit with cues for hand placement, Min assist for sit to supine for slight assist for LEs into bed and Min/mod for scooting HOB with max cuing for LE placement and UE placement.  Transfers Transfers: Sit to Stand;Stand to Sit Sit to Stand: 2: Max assist;With upper extremity assist;From bed;From elevated surface Stand to Sit: 2: Max assist;With upper extremity assist;To elevated surface;To bed Details for Transfer Assistance: Requires max assist to rise with max cuing for hand placement and LE mangement due to pt tendency  to stand with feet together.  Attempted sit to stand x 4 reps, however pt unable to maintain standing position > 30 secs.  Ambulation/Gait General Gait Details: Deferred gait due to inability to stand, will need +2 assist for safety.  Stairs: No Wheelchair Mobility Wheelchair Mobility: No    Exercises     PT Goals Acute Rehab PT Goals PT Goal Formulation: Patient unable to participate in goal setting Time For Goal Achievement: 11/04/11 Potential to Achieve Goals: Fair Pt will go Supine/Side to Sit: with modified independence PT Goal: Supine/Side to Sit - Progress: Goal set today Pt will go Sit to Supine/Side: with modified independence PT Goal: Sit to Supine/Side - Progress: Goal set today Pt will go Sit to Stand: with min assist PT Goal: Sit to Stand - Progress: Goal set today Pt will go Stand to Sit: with min assist PT Goal: Stand to Sit - Progress: Goal set today Pt will Transfer Bed to Chair/Chair to Bed: with min assist PT Transfer Goal: Bed to Chair/Chair to Bed - Progress: Goal set today Pt will Ambulate: 1 - 15 feet;with min assist;with least restrictive assistive device PT Goal: Ambulate - Progress: Goal set today  Visit Information  Last PT Received On: 10/21/11 Assistance Needed: +2    Subjective Data  Subjective: "I can try and stand up" Patient Stated Goal: Unable to state goal   Prior Functioning  Home Living Lives With: Other (Comment) (Pt is homeless.  Was living at shelter) Home Adaptive Equipment: Straight cane;Walker - rolling Additional Comments: Pt states he was ambulating with  cane and RW.   Prior Function Level of Independence:  (Per RN, pt was not very mobile at shelter) Vocation: Unemployed Communication Communication: No difficulties    Cognition  Overall Cognitive Status: No family/caregiver present to determine baseline cognitive functioning Arousal/Alertness: Lethargic Orientation Level: Appears intact for tasks assessed Behavior During  Session: Flat affect    Extremity/Trunk Assessment Right Lower Extremity Assessment RLE ROM/Strength/Tone: Deficits RLE ROM/Strength/Tone Deficits: hip flex 3+/5,  knee ext 3/5, knee flex 3/5 RLE Sensation: WFL - Light Touch RLE Coordination: WFL - gross motor Left Lower Extremity Assessment LLE ROM/Strength/Tone: Deficits LLE ROM/Strength/Tone Deficits: hip flex 4/5, knee ext 4/5, knee flex 3/5. LLE Sensation: WFL - Light Touch LLE Coordination: WFL - gross motor Trunk Assessment Trunk Assessment: Kyphotic   Balance    End of Session PT - End of Session Equipment Utilized During Treatment: Gait belt Activity Tolerance: Patient limited by fatigue Patient left: in bed;with call bell/phone within reach Nurse Communication: Mobility status   Page, Meribeth Mattes 10/21/2011, 5:44 PM

## 2011-10-22 MED ORDER — MOXIFLOXACIN HCL 400 MG PO TABS
400.0000 mg | ORAL_TABLET | Freq: Every day | ORAL | Status: DC
  Administered 2011-10-22 – 2011-10-26 (×5): 400 mg via ORAL
  Filled 2011-10-22 (×6): qty 1

## 2011-10-22 NOTE — Progress Notes (Signed)
Subjective: [name] is without any complaints. He feels fine today.  He is watching television.  Objective: Weight change:   Intake/Output Summary (Last 24 hours) at 10/21/11 1230 Last data filed at 10/21/11 1610  Gross per 24 hour  Intake    475 ml  Output   1275 ml  Net   -800 ml    Filed Vitals:   10/21/11 0947  BP: 123/67  Pulse: 73  Temp: 97.8 F (36.6 C)  Resp: 16   General: Patient appears his stated age no acute distress. Cardiovascular: Regular rate rhythm Lungs: Clear bilaterally no wheezes rhonchus or rails Abdomen: Soft nontender nondistended positive bowel sounds Extremities: 1+ edema right lower extremity.  Lab Results: Reviewed  Studies/Results: Dg Chest 2 View  10/20/2011  *RADIOLOGY REPORT*  Clinical Data: Cough and weakness.  CHEST - 2 VIEW  Comparison: Plain films of the chest 01/20/2011, 11/02/2010 and 08/06/2010.  Findings: The lungs are emphysematous with unchanged left basilar scarring. There is some increased opacity in the left lung base worrisome for superimposed pneumonia.  Right lung is clear.  No pneumothorax or pleural effusion.  Heart size is normal.  IMPRESSION: Emphysema and left basilar scar. Increased opacity in the left lung base is worrisome for superimposed pneumonia.  Recommend follow-up plain films to clearing.  Original Report Authenticated By: Bernadene Bell. Maricela Curet, M.D.   Medications: Scheduled Meds:   . citalopram  20 mg Oral Daily  . enoxaparin  40 mg Subcutaneous Q24H  . moxifloxacin  400 mg Intravenous Once  . moxifloxacin  400 mg Intravenous Q24H  . pantoprazole  40 mg Oral Q1200  . risperiDONE  3 mg Oral QHS   Continuous Infusions:   . sodium chloride 75 mL/hr at 10/21/11 1040   PRN Meds:.LORazepam  Assessment/Plan: Community acquired pneumonia (10/20/2011) Continue Avelox , change to by mouth   Polysubstance abuse (04/11/2011)  watch for signs of withdrawal, when necessary Ativan  Debility (10/20/2011)  physical therapy evaluation   History of DVT (deep vein thrombosis) (10/20/2011)  lower extremity Doppler negative for DVT.  Disposition: In 1 to 2 days.   LOS: 2 days   Carollee Massed Pager [phone] 10/22/2011, 11:40 AM

## 2011-10-22 NOTE — Evaluation (Signed)
Occupational Therapy Evaluation Patient Details Name: [name] MRN: [id number] DOB: 1955-10-11 Today's Date: 10/22/2011  OT Assessment / Plan / Recommendation Clinical Impression  Pt presents to OT with decreased I with ADL activity s/p hospitalization for PNA. Pt will need 24/7 A with ADL activty upon DC - likely mod A.    OT Assessment  Patient needs continued OT Services    Follow Up Recommendations    SNF   Equipment Recommendations  Defer to next venue    Frequency Min 1X/week           ADL  Eating/Feeding: Simulated;Set up Where Assessed - Eating/Feeding: Chair Grooming: Simulated;Minimal assistance Where Assessed - Grooming: Supported sitting Upper Body Bathing: Performed;Minimal assistance Where Assessed - Upper Body Bathing: Sitting, chair;Supported Lower Body Bathing: Simulated;Maximal assistance Where Assessed - Lower Body Bathing: Sit to stand from chair Upper Body Dressing: Simulated;Minimal assistance Where Assessed - Upper Body Dressing: Sitting, chair Lower Body Dressing: Simulated;Maximal assistance Where Assessed - Lower Body Dressing: Sit to stand from chair Toilet Transfer: Performed;Other (comment);Moderate assistance (bed to chair) Toilet Transfer Method: Stand pivot Toileting - Clothing Manipulation: Performed;Maximal assistance Where Assessed - Toileting Clothing Manipulation: Standing Toileting - Hygiene: Performed;Maximal assistance;Other (comment) (condom cath had come off) Tub/Shower Transfer: Not assessed Tub/Shower Transfer Method: Not assessed Ambulation Related to ADLs: Pt moves slowly with decreased safety awarenss.  Pt will need 24/7 mod A upon DC    OT Goals Acute Rehab OT Goals OT Goal Formulation: With patient Time For Goal Achievement: 11/05/11 Potential to Achieve Goals: Fair ADL Goals Pt Will Perform Grooming: with supervision;Standing at sink ADL Goal: Grooming - Progress: Goal set today Pt Will Perform Lower Body  Dressing: with min assist;Sit to stand from bed ADL Goal: Lower Body Dressing - Progress: Goal set today Pt Will Transfer to Toilet: with min assist;Comfort height toilet ADL Goal: Toilet Transfer - Progress: Goal set today Pt Will Perform Toileting - Clothing Manipulation: with min assist;Standing ADL Goal: Toileting - Clothing Manipulation - Progress: Goal set today Pt Will Perform Toileting - Hygiene: with min assist;Standing at 3-in-1/toilet ADL Goal: Toileting - Hygiene - Progress: Goal set today        Prior Functioning  Home Living Lives With: Other (Comment) (Pt is homeless.  Was living at shelter) Home Adaptive Equipment: Straight cane;Walker - rolling Additional Comments: Pt states he was ambulating with cane and RW.   Prior Function Level of Independence:  (Per RN, pt was not very mobile at shelter) Vocation: Unemployed Communication Communication: No difficulties;Other (comment) (very flat affect)    Cognition  Overall Cognitive Status: No family/caregiver present to determine baseline cognitive functioning Arousal/Alertness: Lethargic Orientation Level: Appears intact for tasks assessed Behavior During Session: Flat affect       Mobility Bed Mobility Bed Mobility: Sit to Supine;Supine to Sit;Scooting to HOB Supine to Sit: 5: Supervision;HOB elevated Sit to Supine: HOB elevated;4: Min assist Scooting to HOB: 3: Mod assist;4: Min assist Transfers Sit to Stand: With upper extremity assist;From bed;From elevated surface;3: Mod assist Stand to Sit: With upper extremity assist;To elevated surface;To bed;3: Mod assist Details for Transfer Assistance: Decreased safety awareness with transfer from bed to chair         End of Session OT - End of Session Activity Tolerance: Patient limited by fatigue Patient left: in chair;with call bell/phone within reach   [facility], [place]ung 10/22/2011, 10:21 AM

## 2011-10-23 NOTE — Progress Notes (Signed)
Subjective: Mr. Glassco is without any complaints. He feels fine today.  He states that he lives with his parents and that they do not help him much.  Objective: Weight change:   Intake/Output Summary (Last 24 hours) at 10/21/11 1230 Last data filed at 10/21/11 4315  Gross per 24 hour  Intake    475 ml  Output   1275 ml  Net   -800 ml    Filed Vitals:   10/21/11 0947  BP: 123/67  Pulse: 73  Temp: 97.8 F (36.6 C)  Resp: 16   General: Patient appears his stated age no acute distress. Cardiovascular: Regular rate rhythm Lungs: Clear bilaterally no wheezes rhonchus or rails Abdomen: Soft nontender nondistended positive bowel sounds Extremities: 1+ edema right lower extremity.  Lab Results: Reviewed  Studies/Results: Dg Chest 2 View  10/20/2011  *RADIOLOGY REPORT*  Clinical Data: Cough and weakness.  CHEST - 2 VIEW  Comparison: Plain films of the chest 01/20/2011, 11/02/2010 and 08/06/2010.  Findings: The lungs are emphysematous with unchanged left basilar scarring. There is some increased opacity in the left lung base worrisome for superimposed pneumonia.  Right lung is clear.  No pneumothorax or pleural effusion.  Heart size is normal.  IMPRESSION: Emphysema and left basilar scar. Increased opacity in the left lung base is worrisome for superimposed pneumonia.  Recommend follow-up plain films to clearing.  Original Report Authenticated By: Bernadene Bell. Maricela Curet, M.D.   Medications: Scheduled Meds:   . citalopram  20 mg Oral Daily  . enoxaparin  40 mg Subcutaneous Q24H  . moxifloxacin  400 mg Intravenous Once  . moxifloxacin  400 mg Intravenous Q24H  . pantoprazole  40 mg Oral Q1200  . risperiDONE  3 mg Oral QHS   Continuous Infusions:   . sodium chloride 75 mL/hr at 10/21/11 1040   PRN Meds:.LORazepam  Assessment/Plan: Community acquired pneumonia (10/20/2011) Continue Avelox by mouth   Polysubstance abuse (04/11/2011)  watch for signs of withdrawal, when necessary  Ativan  Debility (10/20/2011) Physical therapy evaluation patient may need to go to short-term skilled nursing facility based on PT/OT note.   History of DVT (deep vein thrombosis) (10/20/2011)  lower extremity Doppler negative for DVT.  Disposition: Social work consulted, when bed available.   LOS: 3 days   Carollee Massed Pager [phone] 10/23/2011, 5:24 PM

## 2011-10-23 NOTE — Progress Notes (Signed)
Physical Therapy Treatment Patient Details Name: [name] MRN: [id number] DOB: 02-Feb-1956 Today's Date: 10/23/2011 Time: 1131-1205 PT Time Calculation (min): 34 min  PT Assessment / Plan / Recommendation Comments on Treatment Session  Pt was able to walk today with RW and assist, but he still has a very slowed repsonse and difficulty initiating movement. He keeps flexed posture in trunk and is unable to extend hips to neutral in standing. He will need continued assist to promote incrased mobility     Follow Up Recommendations  Home health PT;Skilled nursing facility    Equipment Recommendations  Defer to next venue    Frequency Min 3X/week   Plan Discharge plan remains appropriate;Frequency remains appropriate    Precautions / Restrictions Precautions Precautions: Fall Restrictions Weight Bearing Restrictions: No   Pertinent Vitals/Pain    Mobility  Bed Mobility Bed Mobility: Supine to Sit;Sitting - Scoot to Edge of Bed Supine to Sit: Other (comment);3: Mod assist (pt stopped half way up into sitting , assist to get to sitti) Sitting - Scoot to Edge of Bed: 2: Max assist (pulled forward on pad) Details for Bed Mobility Assistance: pt needed assit to complete activity.  Long lag time for responses with pt will decreased spontaneous movement Transfers Transfers: Sit to Stand;Stand to Sit Sit to Stand: 1: +2 Total assist;With upper extremity assist Sit to Stand: Patient Percentage: 30% Stand to Sit: 4: Min assist;With upper extremity assist Details for Transfer Assistance: pt rquired +2 total assist at start of treatment, and gradually progressed to standing with min assist at end of treatment Ambulation/Gait Ambulation/Gait Assistance: 4: Min assist Ambulation Distance (Feet): 300 Feet (100x3) Assistive device: Rolling walker Ambulation/Gait Assistance Details: Pt was able to walk with RW today.  He kept head in forward flexion with flex at upper back and hips and knees  He needed assist to help advance RW Gait Pattern: Decreased step length - right;Decreased step length - left Gait velocity: slow General Gait Details: Pt with fixed posture in semi flexion with decreased head rotation.  Attempted to change gait speed or add rotation to head or trunk, but pt was unable  Stairs: No Wheelchair Mobility Wheelchair Mobility: No    Exercises     PT Goals Acute Rehab PT Goals PT Goal: Supine/Side to Sit - Progress: Progressing toward goal PT Goal: Sit to Supine/Side - Progress: Progressing toward goal PT Goal: Sit to Stand - Progress: Progressing toward goal PT Goal: Stand to Sit - Progress: Progressing toward goal PT Transfer Goal: Bed to Chair/Chair to Bed - Progress: Progressing toward goal Pt will Ambulate: >150 feet;with supervision PT Goal: Ambulate - Progress: Updated due to goal met  Visit Information  Last PT Received On: 10/23/11 Assistance Needed: +2 (safety)    Subjective Data  Subjective: pepperoni pizza ( when asked what pt likes to watch on TV) Patient Stated Goal: Unable to state goal   Cognition  Overall Cognitive Status: No family/caregiver present to determine baseline cognitive functioning Arousal/Alertness: Awake/alert Orientation Level: Appears intact for tasks assessed Behavior During Session: Flat affect Cognition - Other Comments: pt followed only intermittent commands with long lag time for responses.     Balance  Balance Balance Assessed: No  End of Session PT - End of Session Equipment Utilized During Treatment: Gait belt Activity Tolerance: Patient limited by fatigue Patient left: in chair;with call bell/phone within reach    Donnetta Hail 10/23/2011, 1:23 PM

## 2011-10-24 NOTE — Progress Notes (Signed)
Brief narrative: Patient was admitted with community-acquired pneumonia. He has a history of Paranoid schizophrenia. He has been working with PT and OT and the recommendation is still nursing facility for short-term rehabilitation since he lives with his parents and per patient they don't help him much. He is awaiting for bed placement. He is on oral antibiotic at this time to complete 7-10 days course.      Subjective: [name] is without any complaints.  Objective: Weight change:   Intake/Output Summary (Last 24 hours) at 10/21/11 1230 Last data filed at 10/21/11 7829  Gross per 24 hour  Intake    475 ml  Output   1275 ml  Net   -800 ml    Filed Vitals:   10/21/11 0947  BP: 123/67  Pulse: 73  Temp: 97.8 F (36.6 C)  Resp: 16   General: Patient appears his stated age no acute distress. Cardiovascular: Regular rate rhythm Lungs: Clear bilaterally no wheezes rhonchus or rails Abdomen: Soft nontender nondistended positive bowel sounds Extremities: 1+ edema right lower extremity.  Lab Results: Reviewed  Studies/Results: Dg Chest 2 View  10/20/2011  *RADIOLOGY REPORT*  Clinical Data: Cough and weakness.  CHEST - 2 VIEW  Comparison: Plain films of the chest 01/20/2011, 11/02/2010 and 08/06/2010.  Findings: The lungs are emphysematous with unchanged left basilar scarring. There is some increased opacity in the left lung base worrisome for superimposed pneumonia.  Right lung is clear.  No pneumothorax or pleural effusion.  Heart size is normal.  IMPRESSION: Emphysema and left basilar scar. Increased opacity in the left lung base is worrisome for superimposed pneumonia.  Recommend follow-up plain films to clearing.  Original Report Authenticated By: Bernadene Bell. Maricela Curet, M.D.   Medications: Scheduled Meds:   . citalopram  20 mg Oral Daily  . enoxaparin  40 mg Subcutaneous Q24H  . moxifloxacin  400 mg Intravenous Once  . moxifloxacin  400 mg Intravenous Q24H  . pantoprazole   40 mg Oral Q1200  . risperiDONE  3 mg Oral QHS   Continuous Infusions:   . sodium chloride 75 mL/hr at 10/21/11 1040   PRN Meds:.LORazepam  Assessment/Plan: Community acquired pneumonia (10/20/2011) Continue Avelox by mouth   Polysubstance abuse (04/11/2011)  watch for signs of withdrawal, when necessary Ativan  Debility (10/20/2011) Physical therapy evaluation patient,  short-term skilled nursing facility based on PT/OT note.   History of DVT (deep vein thrombosis) (10/20/2011)  lower extremity Doppler negative for DVT.  Disposition: Social work consulted, when bed available.   LOS: 4 days   Carollee Massed Pager [phone] 10/24/2011, 5:30 PM

## 2011-10-24 NOTE — Progress Notes (Signed)
Occupational Therapy Treatment Patient Details Name: [name] MRN: [id number] DOB: 1955-11-25 Today's Date: 10/24/2011 Time: 2952-8413 OT Time Calculation (min): 13 min  OT Assessment / Plan / Recommendation Comments on Treatment Session Con't to recommend st snf.    Follow Up Recommendations  Skilled nursing facility    Equipment Recommendations  Defer to next venue    Frequency Min 1X/week   Plan Discharge plan remains appropriate    Precautions / Restrictions Precautions Precautions: Fall   Pertinent Vitals/Pain     ADL  Toilet Transfer: Performed;Moderate assistance Toilet Transfer Method: Stand pivot ADL Comments: Strongly encouraged pt to complete grooming or wash up from the chair. He adamantly refused stating, "Ive never taken care of myself. Why should I start now. Pt with poor safety awareness. Max VCS for safe transfer to chair. Pt attempted to sit before he completely turned.    OT Goals ADL Goals ADL Goal: Toilet Transfer - Progress: Progressing toward goals  Visit Information  Last OT Received On: 10/24/11 Assistance Needed: +1    Subjective Data  Subjective: "I've never taken care of my teeth."   Prior Functioning       Cognition  Overall Cognitive Status: No family/caregiver present to determine baseline cognitive functioning Arousal/Alertness: Awake/alert Orientation Level: Appears intact for tasks assessed Behavior During Session: Flat affect Cognition - Other Comments: Pt slow to respond.    Mobility Bed Mobility Supine to Sit: 3: Mod assist Sitting - Scoot to Edge of Bed: 2: Max assist Details for Bed Mobility Assistance: Pt required assist to bring hips parallel to EOB. Very slow when mobilizing. Transfers Sit to Stand: 3: Mod assist;With upper extremity assist;From bed Stand to Sit: 3: Mod assist;With upper extremity assist;To chair/3-in-1 Details for Transfer Assistance: Max cues for hand placement, safety and technique.     Exercises    Balance    End of Session OT - End of Session Activity Tolerance: Patient tolerated treatment well Patient left: in chair;with call bell/phone within reach   Desirey Keahey A OTR/L [phone] 10/24/2011, 3:43 PM

## 2011-10-25 ENCOUNTER — Inpatient Hospital Stay (HOSPITAL_COMMUNITY): Payer: Self-pay

## 2011-10-25 DIAGNOSIS — F0280 Dementia in other diseases classified elsewhere without behavioral disturbance: Secondary | ICD-10-CM | POA: Diagnosis present

## 2011-10-25 DIAGNOSIS — F1020 Alcohol dependence, uncomplicated: Secondary | ICD-10-CM

## 2011-10-25 DIAGNOSIS — F1027 Alcohol dependence with alcohol-induced persisting dementia: Secondary | ICD-10-CM | POA: Diagnosis present

## 2011-10-25 DIAGNOSIS — F1910 Other psychoactive substance abuse, uncomplicated: Secondary | ICD-10-CM

## 2011-10-25 DIAGNOSIS — F200 Paranoid schizophrenia: Secondary | ICD-10-CM

## 2011-10-25 MED ORDER — GADOBENATE DIMEGLUMINE 529 MG/ML IV SOLN
11.0000 mL | Freq: Once | INTRAVENOUS | Status: AC | PRN
  Administered 2011-10-25: 11 mL via INTRAVENOUS

## 2011-10-25 MED ORDER — MOXIFLOXACIN HCL 400 MG PO TABS: 400.0000 mg | ORAL_TABLET | Freq: Every day | ORAL | Status: AC

## 2011-10-25 NOTE — Discharge Summary (Signed)
Physician Discharge Summary  [name] MRN: [id number] DOB/AGE: [birth year] 56 y.o.  PCP: Sheila Oats, MD, MD   Admit date: 10/20/2011 Discharge date: 10/25/2011  Discharge Diagnoses:     *Community acquired pneumonia Paranoid schizophrenia  Polysubstance abuse  Debility  History of DVT (deep vein thrombosis)   Medication List  As of 10/25/2011 10:45 AM   TAKE these medications         acetaminophen 325 MG tablet   Commonly known as: TYLENOL   Take 650 mg by mouth every 6 (six) hours as needed. For pain relief      citalopram 20 MG tablet   Commonly known as: CELEXA   Take 20 mg by mouth daily.      moxifloxacin 400 MG tablet   Commonly known as: AVELOX   Take 1 tablet (400 mg total) by mouth daily at 6 PM.      ondansetron 4 MG disintegrating tablet   Commonly known as: ZOFRAN-ODT   Take 1 tablet (4 mg total) by mouth every 8 (eight) hours as needed for nausea.      risperiDONE 3 MG tablet   Commonly known as: RISPERDAL   Take 3 mg by mouth at bedtime.            Discharge Condition:   Disposition: 01-Home or Self Care   Consults: Psychiatry   Significant Diagnostic Studies: Dg Chest 2 View  10/20/2011  *RADIOLOGY REPORT*  Clinical Data: Cough and weakness.  CHEST - 2 VIEW  Comparison: Plain films of the chest 01/20/2011, 11/02/2010 and 08/06/2010.  Findings: The lungs are emphysematous with unchanged left basilar scarring. There is some increased opacity in the left lung base worrisome for superimposed pneumonia.  Right lung is clear.  No pneumothorax or pleural effusion.  Heart size is normal.  IMPRESSION: Emphysema and left basilar scar. Increased opacity in the left lung base is worrisome for superimposed pneumonia.  Recommend follow-up plain films to clearing.  Original Report Authenticated By: Bernadene Bell. Maricela Curet, M.D.      Microbiology: No results found for this or any previous visit (from the past 240 hour(s)).   Labs: No results  found for this or any previous visit (from the past 48 hour(s)).   HPI :Patient is a 56 y.o. male presenting with vomiting. The history is provided by the patient.  This is a new problem. The current episode started 12 to 24 hours ago. He had a history of DVT in the past (JAN 2013)not sure if finished the course of his coumadin. He had some Right leg swelling maybe for some months. He was admitted for community acquired pneumonia      HOSPITAL COURSE:   .Community acquired pneumonia - treated with  Avelox patient appears to be nontoxic.Will continue for another 5 days   .Polysubstance abuse - patient denied any recent alcohol or substance abuse states he hasn't had any alcohol for the past 3-4 weeks. No signs of alcohol withdrawal. Will need psyche to determine capacity . Will dc to shelter if he does have capacity ,today. Physical therapy did see the patient and recommended SNF versus home health PT followup. At this time the patient has been denied been off of the 9 kg in facility. The family does not want to take him back. The patient has also refused to complete the [facility] benefits package. If the patient does have capacity then he will go back to either a shelter or a ministry today  Right leg swelling patient had a recent DVT and whether he completed his course of Coumadin is not known. Repeat Doppler of bilateral lower extremities was negative  Discharge Exam:  Blood pressure 115/70, pulse 82, temperature 98 F (36.7 C), temperature source Oral, resp. rate 17, height 5\' 11"  (1.803 m), weight 56.9 kg (125 lb 7.1 oz), SpO2 97.00%.   1. General: in No Acute distress  2. Psychological: Alert and Oriented, but slow to answer, disheveled  3. Head/ENT: Dry Mucous Membranes  Head Non traumatic, neck supple  Poor Dentition  4. SKIN: decreased Skin turgor, Skin clean Dry and intact no rash  5. Heart: Regular rate and rhythm no Murmur, Rub or gallop  6. Lungs: Clear to auscultation  bilaterally, no wheezes or crackles  7. Abdomen: Soft, non-tender, Non distended  8. Lower extremities: no clubbing, cyanosis, 1 + edema on the right  9. Neurologically Grossly intact, moving all 4 extremities equally, couldn't walk and has diffuse weakness and low extremities.  10. MSK: Normal range of motion   Discharge Orders    Future Orders Please Complete By Expires   Diet - low sodium heart healthy      Increase activity slowly      Call MD for:  persistant nausea and vomiting      Call MD for:  severe uncontrolled pain      Call MD for:  difficulty breathing, headache or visual disturbances      Call MD for:  persistant dizziness or light-headedness           Signed: Richarda Overlie 10/25/2011, 10:45 AM

## 2011-10-25 NOTE — Consult Note (Signed)
Reason for Consult  Evaluate Capacity, Polysubstance Dependence Referring Physician: Dr. Marlan Palau is an 56 y.o. male.  HPI:The history is provided by the patient and the EMS personnel.  56 y/o M with hx of paranoid schizophrenia, malingering, and homelessness presents to ED with c/c of weakness. Nursing notes describe an inability to walk, which resulted in the homeless shelter refusing to allow him to stay. Pt tells me he does not know why he is in the ED. He does relate 2 visits yesterday for nausea and vomiting which have subsided. He endorses generalized weakness for the last few days and describes an inability to walk more than a few steps. Also describes a wet but non-productive cough for the last several weeks, unchanged and not assoc with fever, chills, nasal congestion, sore throat, chest pain, or shortness of breath. Denies any back pain/injury, incontinence, or LE numbness. Nursing note describes pt has been incontinent of urine, he reports this is secondary to an inability to get to the bathroom  Plan for discharge written today  AXIS I Hx of Schizophrenia, Polysubstance abuse, acohol and Crack Cocaine AXIS II Deferred AXIS III  Past Medical History  Diagnosis Date  . Achilles tendon rupture 01/09/2011  . Radial fracture 11/09/2010  . Polysubstance abuse 04/11/2011  . Hip fracture 08/11/2010  . Urinary retention 04/11/2011  . DVT (deep venous thrombosis) 01/09/2011    right leg per pt  . Paranoid schizophrenia     Past Surgical History  Procedure Date  . Hip surgery     right hip  AXIS IV  Poor support system, conflicts with parents, finances, health care, mental health care, homelessness AXIS V  GAF  35   History reviewed. No pertinent family history.  Social History:  reports that he has been smoking Cigarettes.  He has a 14 pack-year smoking history. He has never used smokeless tobacco. He reports that he drinks alcohol. He reports that he uses illicit drugs  (Cocaine, Methamphetamines, and Marijuana).  Allergies: No Known Allergies  Medications: I have reviewed the patient's current medications.  No results found for this or any previous visit (from the past 48 hour(s)).  No results found.  Review of Systems  Unable to perform ROS: other   Blood pressure 117/76, pulse 78, temperature 98.3 F (36.8 C), temperature source Oral, resp. rate 20, height 5\' 11"  (1.803 m), weight 56.9 kg (125 lb 7.1 oz), SpO2 96.00%. Physical Exam  Assessment/Plan:  Chart reviewed,  Discussed with RN, Discussed with Dr. Susie Cassette.  LM for Psych CSW Pt is in room, dressed, hair combed, sitting on bed.  He is holding urinal in hand.  He is asked if he needs to use it, says no and is told to place it on the bedside table.  He does.  He is asked to change direction to participate in interview.  He changes 1/2 way and begins to lie down.  He stops moving at a 45 degree angle [appears very uncomfortable]  He is directed to lie down and he does.  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  During this evaluation, he picks up the urinal 4 more times without any effort to use it even when asked if he needs to.  He denies suicidal ideation, denies hearing any voices.  His eye contact is poor.  He answers questions appropriately; just slowly.  He says he has been homeless most of his 52 years.  He says he has used alcohol but does not volunteer to quantify it.  He admits to Crack cocaine but does not elaborate on that; denies most other drugs.  He does say he smokes 1/2 ppd and denies any need for nicotine patch.  He says he began smoking marijuana at age 39, graduated but parents made that year a 'disaster'.  Why?  Because they took me to a psychologist, will not elaborate. [took several questions for him to explain disaster]. He knew serial sevens accurately and denies suicidal ideation.   This patient demonstrates intellectual capacity by  report of high SAT scores in HS and he accurate [ if not memorized!] serial sevens.  It is possible there are several considerations for his psychomotor retardation.  He has a Dx of Schizophrenia.  There are no 'positive AH , bizarre behavior or thought blocking' observed but thi behavior may be representative of the 'negative e.g. Catatonic' behavior in Schizophrenia.  Also, consider that his SGA Risperdal, risperidone, 3 mg dose [especially IF he has not been taking it causes a greater dopamine blockade and EPS Dystonia is caused as a side effect.  Aside from mental illness, there is the longitudinal, chronic effects of alcohol and drugs that cause mental status changes complicated by poor nutrition.  This patient may have either a familial neurodegenerative mental condition.  His MRI report of atrophy [frontal lobe? Or general] and microvascular ischemic changes may contribute to a dementia in regions responsible for apraxia, not necessarily cognitive function.  Parental contact for which permission has been given will help elucidate progression and complications that have contributed to his present condition.  Mental Status Evaluation:ss Appearance:  casually dressed, thin & gaunt looking and deficient albumin suggests poor nutrition  Behavior:  psychomotor retardation and requires prompts to follow through actions - apraxia?  Speech:  delayed, increased latency of response and soft  Mood:  constricted  Affect:  flat  Thought Process:  Extremely slow  Thought Content:  Void of expressive thought about thought content  Sensorium:  person, place, time/date and situation  Cognition:  impaired due to multiple factors  Insight:  impaired due to multiple factors  Judgment:  impaired due to due to multiple factors  RECOMMENDATION 1, Contact father [phone] for collaborative information; Inquire about any family hx of dementia 2. Consider EEG sleep deprived is preferred 3. Consider observation of  Risperdal reduction to 1 mg 2 X daily - watching for any worse EPS 4. Consider OT evaluation 5. Will follow pt as long as he is in hospital  Marie Chow 10/25/2011, 6:32 PM

## 2011-10-25 NOTE — Progress Notes (Signed)
Physical Therapy Treatment Patient Details Name: [name] MRN: [id number] DOB: 1956/03/07 Today's Date: 10/25/2011 Time: 0930-1020 PT Time Calculation (min): 50 min 1 gt, 2 ta  PT Assessment / Plan / Recommendation Comments on Treatment Session  Pt walked with RW +1 min-mod assist 125 ft with very slow movements and slow to no response to VC given.  He needs assistance to turn RW and VC needed for proper technique.  Pts cognition and processing was slow to nonresponsive at times.  Pt required mod assistance with hygiene, static standing at sink to brush teeth, and mod assist for toilet transfers.    Follow Up Recommendations       Equipment Recommendations       Frequency     Plan Discharge plan remains appropriate    Precautions / Restrictions     Pertinent Vitals/Pain No c/o pain    Mobility  Bed Mobility Bed Mobility: Sitting - Scoot to Edge of Bed Sitting - Scoot to Edge of Bed: 4: Min guard (VC given to scoot to EOB and to use UE to push off of bed) Details for Bed Mobility Assistance: very slow movements; no response to VC given, hand over hand/tactile cuing needed to scoot to EOB Transfers Transfers: Sit to Stand;Stand to Sit Sit to Stand: 3: Mod assist Stand to Sit: 3: Mod assist;To chair/3-in-1;To toilet (VC given to turn RW and back up to chair.  ) Details for Transfer Assistance: Max cues needed to position RW in front of chair and complete turn before attempting to sit in chair; VC needed for safety and technique; manual assistance needed to turn RW Ambulation/Gait Ambulation/Gait Assistance: 4: Min assist Ambulation Distance (Feet): 125 Feet Assistive device: Rolling walker Ambulation/Gait Assistance Details: pt would not respond to VC given to correct gait technique  for safety to stand closer to RW, to straighten back when ambulating. Pt used step to pattern first half of gait distance, and step through pattern on the way back to room. Pt required  assistance turning RW. Gait Pattern: Step-to pattern;Step-through pattern;Decreased step length - right;Decreased step length - left;Trunk flexed;Shuffle Gait velocity: slow Stairs: No Wheelchair Mobility Wheelchair Mobility: No    Exercises     PT Goals Acute Rehab PT Goals PT Goal Formulation: With patient Potential to Achieve Goals: Fair Pt will go Sit to Stand: with min assist PT Goal: Sit to Stand - Progress: Progressing toward goal Pt will go Stand to Sit: with min assist PT Goal: Stand to Sit - Progress: Progressing toward goal Pt will Ambulate: >150 feet;with supervision PT Goal: Ambulate - Progress: Progressing toward goal  Visit Information  Last PT Received On: 10/25/11 Assistance Needed: +1    Subjective Data  Subjective: Pt had no c/o pain Patient Stated Goal: pt had no response to question   Cognition  Overall Cognitive Status: Impaired Area of Impairment: Following commands;Safety/judgement Arousal/Alertness: Other (comment) (slow to respond or no response given to questions) Orientation Level: Appears intact for tasks assessed Behavior During Session: Flat affect Following Commands: Follows one step commands inconsistently Cognition - Other Comments: pt slow to respond, or no response given at all to questions or comments    Balance     End of Session PT - End of Session Equipment Utilized During Treatment: Gait belt Activity Tolerance: Other (comment) (pt unresponsive to [facility] given for safety 50% of time) Patient left: in chair;with call bell/phone within reach    Beverly [place], Denice SPTA 10/25/2011, 10:46 AM  Felecia Shelling  PTA [facility]  Acute  Rehab Pager     [phone]

## 2011-10-25 NOTE — Progress Notes (Signed)
CSW consulted with the pt about placement after discharge. The pt is not sure about his placement but is okay with being faxed out. Per consult with collaterals; marsha CSW, the pt may be a difficult placement. 9 SNF options have turned down an offer for a bed. The pt is not available to return to [facility] and is not accepted at [facility]. CSW was also informed that the pt refuses to complete there VA benefits package. He agrees with wanting help in the beginning, but then never follows through.  Kayleen Memos. Leighton Ruff [phone]

## 2011-10-26 ENCOUNTER — Inpatient Hospital Stay (HOSPITAL_COMMUNITY)
Admit: 2011-10-26 | Discharge: 2011-10-26 | Disposition: A | Discharge status: Home / Self Care | Payer: Self-pay | Attending: Internal Medicine | Admitting: Internal Medicine

## 2011-10-26 NOTE — Progress Notes (Signed)
Subjective: Patient denies any complaints,  Objective: Vital signs in last 24 hours: Filed Vitals:   10/25/11 0513 10/25/11 1408 10/25/11 2045 10/26/11 0510  BP: 115/70 117/76 115/68 121/75  Pulse: 82 78 72 76  Temp: 98 F (36.7 C) 98.3 F (36.8 C) 98.3 F (36.8 C) 97.6 F (36.4 C)  TempSrc: Oral Oral Oral Oral  Resp: 17 20 18 16   Height:      Weight:      SpO2: 97% 96% 97% 98%    Intake/Output Summary (Last 24 hours) at 10/26/11 1043 Last data filed at 10/26/11 0511  Gross per 24 hour  Intake   1560 ml  Output   1651 ml  Net    -91 ml    Weight change:   . General: in No Acute distress  2. Psychological: Alert and Oriented, but slow to answer, disheveled  3. Head/ENT: Dry Mucous Membranes  Head Non traumatic, neck supple  Poor Dentition  4. SKIN: decreased Skin turgor, Skin clean Dry and intact no rash  5. Heart: Regular rate and rhythm no Murmur, Rub or gallop  6. Lungs: Clear to auscultation bilaterally, no wheezes or crackles  7. Abdomen: Soft, non-tender, Non distended  8. Lower extremities: no clubbing, cyanosis, 1 + edema on the right  9. Neurologically Grossly intact, moving all 4 extremities equally, couldn't walk and has diffuse weakness and low extremities.  10. MSK: Normal range of motion   Lab Results: No results found for this or any previous visit (from the past 24 hour(s)).   Micro: No results found for this or any previous visit (from the past 240 hour(s)).  Studies/Results: Mr [name] ZO Contrast  10/26/2011  *RADIOLOGY REPORT*  Clinical Data: Memory loss.  Schizophrenia and polysubstance abuse. Pneumonia and vomiting.  MRI HEAD WITHOUT AND WITH CONTRAST  Technique:  Multiplanar, multiecho pulse sequences of the brain and surrounding structures were obtained according to standard protocol without and with intravenous contrast  Contrast: 11mL MULTIHANCE GADOBENATE DIMEGLUMINE 529 MG/ML IV SOLN  Comparison: CT head without contrast 03/03/2011.   Findings: No acute infarct, hemorrhage, mass lesion is present. 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  There is a remote infarct of the left external capsule.  Age advanced atrophy extensive white matter disease is present.  Flow is present in the major intracranial arteries.  The globes and orbits are intact.  A fluid level is present in the right frontal sinus.  Mucosal thickening is noted in the anterior ethmoid air cells bilaterally.  The mastoid air cells are clear.  IMPRESSION:  1.  No acute or focal abnormality to explain the patient's symptoms. 2.  Age advanced atrophy and extensive white matter disease likely reflects the sequelae of chronic microvascular ischemia. 3.  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  Original Report Authenticated By: Jamesetta Orleans. MATTERN, M.D.    Medications:  Scheduled Meds:   . citalopram  20 mg Oral Daily  . enoxaparin  40 mg Subcutaneous Q24H  . moxifloxacin  400 mg Oral q1800  . mulitivitamin with minerals  1 tablet Oral Daily  . pantoprazole  40 mg Oral Q1200  . risperiDONE  3 mg Oral QHS   Continuous Infusions:   . 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

## 2011-10-26 NOTE — Procedures (Signed)
EEG NUMBER:  REFERRING PHYSICIAN:  Richarda Overlie, MD  HISTORY:  A 56 year old male with generalized weakness.  MEDICATIONS:  Celexa, Lovenox, Ativan, Avelox, Zofran, Protonix, Risperdal.  CONDITIONS OF RECORDING:  This is a 16 channel EEG carried out with the patient in the awake and drowsy states.  DESCRIPTION:  The waking background activity consists of a low-voltage, symmetrical, fairly well-organized 10 Hz alpha activity seen from the parieto-occipital and posterotemporal regions.  Low-voltage, fast activity, poorly organized was seen anteriorly, and at times, superimposed on more posterior rhythms.  A mixture of theta and alpha was seen from the central and temporal regions.  The patient drowses with slowing to irregular, low-voltage theta and beta activity.  Stage II sleep was not obtained.  Hypoventilation was not performed. Intermittent photic stimulation failed to elicit any change in the tracing.  IMPRESSION:  This is a normal EEG.          ______________________________ Thana Farr, MD    ZO:XWRU D:  10/26/2011 17:58:28  T:  10/26/2011 21:45:24  Job #:  [id number]

## 2011-10-26 NOTE — Progress Notes (Signed)
Spoke with Pt's father, Mr. Camps.  Pt's father stated that there is no hx of dementia on either side of the family but did state that Pt's mother is "a severe narcoleptic."  Pt's father stated that Pt was dx'd with Paranoid Schizophrenia at the age of 63.  He explained that Pt moved to [place] and that, upon return, moved in.  Pt's father stated that Pt lived with them for 2 years and that Pt was given a car to drive to assist him with job searches, 3 meals to eat and a roof over his head.  Pt's father stated that he and his wife asked Pt to leave when he chose to "watch TV" instead of bettering himself.  He stated that Pt left in the middle of the night.  Pt's father and Pt's mother found Pt at [facility] and asked him to come back.  Pt's father stated that Pt walked away and wouldn't communicate with them.  Pt's father stated that this was 2 years ago.  Since then, they have learned that he had his leg operated on at [facility] 1 year ago and that he was sent to [facility] for rehab.  Upon d/c there, Pt was sent to [facility] homeless shelter in [place], as [employer] wouldn't take him back (father doesn't know the circumstances surrounding this).  Pt's father states that Pt has been in and out of [address] and [facility] and that he has spent some time recently at the psych portion of [facility].  Pt's father states that Pt choose to be homeless.  He states that he is unable to care for Pt, as Pt's father has medical pxs.  CSW thanked Mr. Hornstein for his time.   Providence Crosby, LCSWA Clinical Social Work [phone]

## 2011-10-26 NOTE — Consult Note (Signed)
Reason for Consult Polysubstance abuse Evaluate Capacity Referring Physician: Dr. Marlan Palau is an 56 y.o. male.  HPI:The history is provided by the patient and the EMS personnel.  57 y/o M with hx of paranoid schizophrenia, malingering, and homelessness presents to ED with c/c of weakness. Nursing notes describe an inability to walk, which resulted in the homeless shelter refusing to allow him to stay. Pt tells me he does not know why he is in the ED. He does relate 2 visits yesterday for nausea and vomiting which have subsided. He endorses generalized weakness for the last few days and describes an inability to walk more than a few steps. Also describes a wet but non-productive cough for the last several weeks, unchanged and not assoc with fever, chills, nasal congestion, sore throat, chest pain, or shortness of breath. Denies any back pain/injury, incontinence, or LE numbness. Nursing note describes pt has been incontinent of urine, he reports this is secondary to an inability to get to the bathroom Plan for discharge written today   AXIS I Hx of Schizophrenia, Polysubstance abuse, acohol and Crack Cocaine  AXIS II Deferred  AXIS III  Past Medical History  Diagnosis Date  . Achilles tendon rupture 01/09/2011  . Radial fracture 11/09/2010  . Polysubstance abuse 04/11/2011  . Hip fracture 08/11/2010  . Urinary retention 04/11/2011  . DVT (deep venous thrombosis) 01/09/2011    right leg per pt  . Paranoid schizophrenia     Past Surgical History  Procedure Date  . Hip surgery     right hip  AXIS IV Poor support system, conflicts with parents, finances, health care, mental health care, homelessness  AXIS V GAF 35  History reviewed. No pertinent family history.  Social History:  reports that he has been smoking Cigarettes.  He has a 14 pack-year smoking history. He has never used smokeless tobacco. He reports that he drinks alcohol. He reports that he uses illicit drugs (Cocaine,  Methamphetamines, and Marijuana).  Allergies: No Known Allergies  Medications: I have reviewed the patient's current medications.  No results found for this or any previous visit (from the past 48 hour(s)).  Mr [name] ZO Contrast  10/26/2011  *RADIOLOGY REPORT*  Clinical Data: Memory loss.  Schizophrenia and polysubstance abuse. Pneumonia and vomiting.  MRI HEAD WITHOUT AND WITH CONTRAST  Technique:  Multiplanar, multiecho pulse sequences of the brain and surrounding structures were obtained according to standard protocol without and with intravenous contrast  Contrast: 11mL MULTIHANCE GADOBENATE DIMEGLUMINE 529 MG/ML IV SOLN  Comparison: CT head without contrast 03/03/2011.  Findings: No acute infarct, hemorrhage, mass lesion is present. 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  There is a remote infarct of the left external capsule.  Age advanced atrophy extensive white matter disease is present.  Flow is present in the major intracranial arteries.  The globes and orbits are intact.  A fluid level is present in the right frontal sinus.  Mucosal thickening is noted in the anterior ethmoid air cells bilaterally.  The mastoid air cells are clear.  IMPRESSION:  1.  No acute or focal abnormality to explain the patient's symptoms. 2.  Age advanced atrophy and extensive white matter disease likely reflects the sequelae of chronic microvascular ischemia. 3.  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  Original Report Authenticated By: Jamesetta Orleans. MATTERN, M.D.    Review of Systems  Unable to perform ROS: other   Blood pressure 106/72, pulse 80, temperature 98.8 F (37.1 C), temperature source Oral, resp. rate 20, height 5\' 11"  (1.803 m), weight 56.9 kg (125 lb 7.1 oz), SpO2 97.00%. Physical Exam  Assessment/Plan: Chart reviewed, Discussed with Psych CSW communicating requests with Dr.  Susie Cassette and contacting pt's father for collateral information.  There is likely a diagnosis of Schizophrenia, complicated by chronic alcohol abuse and evidence of lacunae and atrophy associated with microvascular ischemia. These will interfere with functions he clinically has difficulty processing, motor retardation and apraxia.  There are personality changes derived from all these effects.  MRI 10/25/11: Age advanced atrophy and extensive white matter disease likely   reflects the sequelae of chronic microvascular ischemia.     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  Mental Status Evaluation: Appearance:  younger than stated age  Behavior:  psychomotor retardation  Speech:  delayed and increased latency of response  Mood:  constricted  Affect:  blunted  Thought Process:  Extremely slow  Thought Content:  vacuous  Sensorium:  person and place  Cognition:  impaired due to apparent chronic alcohol abuse  Insight:  poor  Judgment:  fair  While he may answer appropriately to questions, his alcohol use and decision [apparently] to remain homeless are cause to question his capacity.  IF this is a progressive condition, he will not be able to continue to subsist trying o live on his own. The studies to rule out specific conditions associated with his psychomotor retardation: EEG, HIV test, MRI have been complete.  RECOMMENDATION  1. Consider referral to SNF when medically stable.  2. Consider site where OT may be provided for further evaluation 3. He may or may not respond to Rx such as Provigil  [cost is a matter]  Mother with severe narcolepsy may genetically contribute to his level of function? And may contribute to an approval for use of Provigil 4. Coverage 4/26-28/13  Dr. Shela Commons  [phone] if needed   Mickeal Skinner 10/26/2011, 11:21 PM

## 2011-10-26 NOTE — Progress Notes (Signed)
Per psych MD request, attempted to obtain collateral information [phone] ).  LM.  CSW to continue to follow.  Providence Crosby, LCSWA Clinical Social Work [phone]

## 2011-10-26 NOTE — Progress Notes (Signed)
Clinical Social Work Department CLINICAL SOCIAL WORK PLACEMENT NOTE 10/26/2011  Patient:  [name],[name]  Account Number:  [id number] Admit date:  10/20/2011  Clinical Social Worker:  Tommi Emery, CLINICAL SOCIAL WORKER  Date/time:  10/26/2011 07:52 AM  Clinical Social Work is seeking post-discharge placement for this patient at the following level of care:      (*CSW will update this form in Epic as items are completed)   10/24/2011  Patient/family provided with [facility] Department of Clinical Social Work's list of facilities offering this level of care within the geographic area requested by the patient (or if unable, by the patient's family).  10/24/2011  Patient/family informed of their freedom to choose among providers that offer the needed level of care, that participate in Medicare, Medicaid or managed care program needed by the patient, have an available bed and are willing to accept the patient.  10/24/2011  Patient/family informed of [facility]' ownership interest in [facility], as well as of the fact that they are under no obligation to receive care at this facility.  PASARR submitted to EDS on  PASARR number received from EDS on   FL2 transmitted to all facilities in geographic area requested by pt/family on   FL2 transmitted to all facilities within larger geographic area on   Patient informed that his/her managed care company has contracts with or will negotiate with  certain facilities, including the following:     Patient/family informed of bed offers received:  10/25/2011 Patient chooses bed at  Physician recommends and patient chooses bed at    Patient to be transferred to  on   Patient to be transferred to facility by   The following physician request were entered in Epic:   Additional Comments:  Deborh Pense C. Leighton Ruff [phone]

## 2011-10-27 LAB — CREATININE, SERUM
Creatinine, Ser: 0.97 mg/dL (ref 0.50–1.35)
GFR calc Af Amer: >90 mL/min (ref >90)
GFR calc non Af Amer: >90 mL/min (ref >90)

## 2011-10-27 MED ORDER — RISPERIDONE 1 MG PO TABS: 1.0000 mg | ORAL_TABLET | Freq: Two times a day (BID) | ORAL | Status: DC

## 2011-10-27 NOTE — Discharge Summary (Addendum)
Physician Discharge Summary  [name] MRN: [id number] DOB/AGE: 05-Jan-1956 56 y.o.  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 For pain relief      citalopram 20 MG tablet   Commonly known as: CELEXA   Take 20 mg by mouth daily.      moxifloxacin 400 MG tablet   Commonly known as: AVELOX   Take 1 tablet (400 mg total) by mouth daily at 6 PM.      ondansetron 4 MG disintegrating tablet   Commonly known as: ZOFRAN-ODT   Take 1 tablet (4 mg total) by mouth every 8 (eight) hours as needed for nausea.      risperiDONE 1 MG tablet   Commonly known as: RISPERDAL   Take 1 mg by mouth at twice a day             Discharge Condition: Stable Disposition: 01-Home or Self Care   Consults: Psychiatry Mickeal Skinner, MD    Significant Diagnostic Studies: Dg Chest 2 View  10/20/2011  *RADIOLOGY REPORT*  Clinical Data: Cough and weakness.  CHEST - 2 VIEW  Comparison: Plain films of the chest 01/20/2011, 11/02/2010 and 08/06/2010.  Findings: The lungs are emphysematous with unchanged left basilar scarring. There is some increased opacity in the left lung base worrisome for superimposed pneumonia.  Right lung is clear.  No pneumothorax or pleural effusion.  Heart size is normal.  IMPRESSION: Emphysema and left basilar scar. Increased opacity in the left lung base is worrisome for superimposed pneumonia.  Recommend follow-up plain films to clearing.  Original Report Authenticated By: Bernadene Bell. Maricela Curet, M.D.   Mr [name] XB  Contrast  10/26/2011  *RADIOLOGY REPORT*  Clinical Data: Memory loss.  Schizophrenia and polysubstance abuse. Pneumonia and vomiting.  MRI HEAD WITHOUT AND WITH CONTRAST  Technique:  Multiplanar, multiecho pulse sequences of the brain and surrounding structures were obtained according to standard protocol without and with intravenous contrast  Contrast: 11mL MULTIHANCE GADOBENATE DIMEGLUMINE 529 MG/ML IV SOLN  Comparison: CT head without contrast 03/03/2011.  Findings: No acute infarct, hemorrhage, mass lesion is present. 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  There is a remote infarct of the left external capsule.  Age advanced atrophy extensive white matter disease is present.  Flow is present in the major intracranial arteries.  The globes and orbits are intact.  A fluid level is present in the right frontal sinus.  Mucosal thickening is noted in the anterior ethmoid air cells bilaterally.  The mastoid air cells are clear.  IMPRESSION:  1.  No acute or focal abnormality to explain the patient's symptoms. 2.  Age advanced atrophy and extensive white matter disease likely reflects the sequelae of chronic microvascular ischemia. 3.  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  Original Report Authenticated By: Jamesetta Orleans. MATTERN, M.D.      Microbiology: No results found for this or any previous  visit (from the past 240 hour(s)).   Labs: Results for orders placed during the hospital encounter of 10/20/11 (from the past 48 hour(s))  CREATININE, SERUM     Status: Normal   Collection Time   10/27/11  4:05 AM      Component Value Range Comment   Creatinine, Ser 0.97  0.50 - 1.35 (mg/dL)    GFR calc non Af Amer >90  >90 (mL/min)    GFR calc Af Amer >90  >90 (mL/min)      HPI :HPI :Patient is a 56 y.o. male presenting with vomiting. The history is provided by the patient. He  presented with complaints of 12 and 24 hours of intractable nausea and vomiting. He had a history of DVT in the past (JAN 2013)not sure if finished the course of his coumadin. He had some Right leg swelling maybe for some months. He was admitted for community acquired pneumonia . He also has a history of paranoid schizophrenia, malingering and homelessness. He complained of generalized weakness and inability to walk, which resulted in a homeless shelter refusing to allow him to stay there.He endorses generalized weakness for the last few days and describes an inability to walk more than a few steps.     HOSPITAL COURSE:  .Community acquired pneumonia - treated with Avelox patient appears to be nontoxic.Will continue for another 3 days   .Polysubstance abuse - patient denied any recent alcohol or substance abuse states he hasn't had any alcohol for the past 3-4 weeks. No signs of alcohol withdrawal. Psychiatry was consulted. He was found to have psychomotor retardation, slow speech, slow rate of response, poor eye contact. He answered questions appropriately. He denied any suicidal ideation. He was good with serial sevens. There are no 'positive AH , bizarre behavior or thought blocking' observed but thi behavior may be representative of the 'negative e.g. Catatonic' behavior in Schizophrenia. Also, consider  Risperdal, risperidone, 3 mg dose [especially IF he has not been taking it causes a greater dopamine blockade and EPS Dystonia is caused as a side effect. Aside from mental illness, there is the longitudinal, chronic effects of alcohol and drugs that cause mental status changes complicated by poor nutrition. This patient may have either a familial neurodegenerative mental condition. His MRI report of atrophy [frontal lobe? Or general] and microvascular ischemic changes may contribute to a dementia in regions responsible for apraxia, not necessarily cognitive function.  #1 per recommendation an EEG was  ordered which is negative  Continue risperidone to 1 mg twice a day PT OT evaluation was done MRI of the brain was done with results as above   Will dc to Pt for D/C today to [facility] ,today. Physical therapy did see the patient and recommended SNF versus home health PT followup. At this time the patient has been denied been off of the 9 kg in facility. The family does not want to take him back. The patient has also refused to complete the [facility] benefits package.   Right leg swelling patient had a recent DVT and whether he completed his course of Coumadin is not known. Repeat Doppler of bilateral lower extremities was negative       Discharge Exam:  Blood pressure 109/63, pulse 71, temperature 98.2 F (36.8 C), temperature source Oral, resp. rate 16, height 5\' 11"  (1.803 m), weight 56.9 kg (125 lb 7.1 oz), SpO2 96.00%.   . General: in No Acute distress  2. Psychological: Alert and Oriented, but slow to answer, disheveled  3. Head/ENT: Dry Mucous Membranes  Head Non traumatic, neck supple  Poor Dentition  4. SKIN: decreased Skin turgor, Skin clean Dry and intact no rash  5. Heart: Regular rate and rhythm no Murmur, Rub or gallop  6. Lungs: Clear to auscultation bilaterally, no wheezes or crackles  7. Abdomen: Soft, non-tender, Non distended  8. Lower extremities: no clubbing, cyanosis, 1 + edema on the right  9. Neurologically Grossly intact, moving all 4 extremities equally, couldn't walk and has diffuse weakness and low extremities.  10. MSK: Normal range of motion      Discharge Orders    Future Orders Please Complete By Expires   Diet - low sodium heart healthy      Diet - low sodium heart healthy      Increase activity slowly      Call MD for:  persistant nausea and vomiting      Call MD for:  severe uncontrolled pain      Call MD for:  difficulty breathing, headache or visual disturbances      Call MD for:  persistant dizziness or light-headedness      Increase activity  slowly      Call MD for:  persistant nausea and vomiting      Call MD for:  severe uncontrolled pain         Follow-up Information    Follow up with Primary care provider in 1 week. (Posthospital followup)          Signed: Richarda Overlie 10/27/2011, 10:55 AM

## 2011-10-27 NOTE — Progress Notes (Signed)
Provided Pt with information on [facility] services.  Pt to be d/c'd.  CSW to sign off.  Providence Crosby, LCSWA Clinical Social Work [phone]

## 2011-10-27 NOTE — Progress Notes (Addendum)
Pt for D/C today to [facility], a homeless center. The family has been contacted, see Marchelle Folks CSW for details. They are unwilling to provide transportation or assistance. Pt is provided with a bus pass. Pt is agreeable to plans.  Kayleen Memos. Leighton Ruff [phone]

## 2011-10-27 NOTE — Progress Notes (Signed)
Pt education r/t skin breakdown from decreased mobility and sedentary lifestyle given to patient. Patient stated he will move himself in the bed and does not need to be turned. Will continue to encourage self repositioning. [name] [facility] 10/27/2011 4:36 AM

## 2011-10-28 ENCOUNTER — Encounter (HOSPITAL_COMMUNITY): Payer: Self-pay | Admitting: *Deleted

## 2011-10-28 ENCOUNTER — Other Ambulatory Visit (HOSPITAL_COMMUNITY): Payer: Self-pay | Admitting: Pharmacy Technician

## 2011-10-28 ENCOUNTER — Emergency Department (HOSPITAL_COMMUNITY)
Admission: EM | Admit: 2011-10-28 | Discharge: 2011-10-28 | Disposition: A | Discharge status: Home / Self Care | Payer: Self-pay | Attending: Emergency Medicine | Admitting: Emergency Medicine

## 2011-10-28 ENCOUNTER — Encounter (HOSPITAL_COMMUNITY): Payer: Self-pay

## 2011-10-28 ENCOUNTER — Emergency Department (HOSPITAL_COMMUNITY)
Admission: EM | Admit: 2011-10-28 | Discharge: 2011-10-29 | Disposition: A | Discharge status: Home / Self Care | Payer: Self-pay | Attending: Emergency Medicine | Admitting: Emergency Medicine

## 2011-10-28 DIAGNOSIS — R112 Nausea with vomiting, unspecified: Secondary | ICD-10-CM | POA: Insufficient documentation

## 2011-10-28 DIAGNOSIS — Z8659 Personal history of other mental and behavioral disorders: Secondary | ICD-10-CM | POA: Insufficient documentation

## 2011-10-28 DIAGNOSIS — Z5900 Homelessness unspecified: Secondary | ICD-10-CM | POA: Insufficient documentation

## 2011-10-28 DIAGNOSIS — F200 Paranoid schizophrenia: Secondary | ICD-10-CM | POA: Insufficient documentation

## 2011-10-28 DIAGNOSIS — R5381 Other malaise: Secondary | ICD-10-CM | POA: Insufficient documentation

## 2011-10-28 DIAGNOSIS — Z765 Malingerer [conscious simulation]: Secondary | ICD-10-CM | POA: Insufficient documentation

## 2011-10-28 DIAGNOSIS — Z79899 Other long term (current) drug therapy: Secondary | ICD-10-CM | POA: Insufficient documentation

## 2011-10-28 DIAGNOSIS — F17200 Nicotine dependence, unspecified, uncomplicated: Secondary | ICD-10-CM | POA: Insufficient documentation

## 2011-10-28 DIAGNOSIS — Z86718 Personal history of other venous thrombosis and embolism: Secondary | ICD-10-CM | POA: Insufficient documentation

## 2011-10-28 LAB — POCT I-STAT, CHEM 8
BUN: 26 mg/dL — ABNORMAL HIGH (ref 6–23)
Calcium, Ion: 1.23 mmol/L (ref 1.12–1.32)
Chloride: 110 mEq/L (ref 96–112)
Creatinine, Ser: 0.9 mg/dL (ref 0.50–1.35)
Glucose, Bld: 105 mg/dL — ABNORMAL HIGH (ref 70–99)
HCT: 39 % (ref 39.0–52.0)
Hemoglobin: 13.3 g/dL (ref 13.0–17.0)
Potassium: 4 mEq/L (ref 3.5–5.1)
Sodium: 143 mEq/L (ref 135–145)
TCO2: 23 mmol/L (ref 0–100)

## 2011-10-28 LAB — URINALYSIS, ROUTINE W REFLEX MICROSCOPIC
Bilirubin Urine: NEGATIVE
Glucose, UA: NEGATIVE mg/dL
Hgb urine dipstick: NEGATIVE
Ketones, ur: NEGATIVE mg/dL
Leukocytes, UA: NEGATIVE
Nitrite: NEGATIVE
Protein, ur: 30 mg/dL — AB
Specific Gravity, Urine: 1.022 (ref 1.005–1.030)
Urobilinogen, UA: 1 mg/dL (ref 0.0–1.0)
pH: 6.5 (ref 5.0–8.0)

## 2011-10-28 LAB — URINE MICROSCOPIC-ADD ON

## 2011-10-28 MED ORDER — ONDANSETRON 4 MG PO TBDP
4.0000 mg | ORAL_TABLET | Freq: Once | ORAL | Status: AC
  Administered 2011-10-28: 4 mg via ORAL
  Filled 2011-10-28: qty 1

## 2011-10-28 MED ORDER — PROMETHAZINE HCL 25 MG PO TABS: 12.5000 mg | ORAL_TABLET | Freq: Four times a day (QID) | ORAL | Status: DC | PRN

## 2011-10-28 NOTE — Discharge Instructions (Signed)
Take your nausea medication once every 6 hours as needed.  Drink plenty of fluids.  You should return to the ER if you develop severe abdominal pain or uncontrolled vomiting.

## 2011-10-28 NOTE — ED Notes (Signed)
Pt was seen this am for N/V, d/c home with Rx for Phenergan, pt never filled prescription, called EMS this afternoon for continued vomiting.

## 2011-10-28 NOTE — ED Notes (Signed)
Tolerating peanut butter and crackers and water

## 2011-10-28 NOTE — ED Notes (Addendum)
Pt in by ems, was at a bus stop, on way home from here after being discharged. C/o continued generalized weakness, nausea. ems bp 138/82, p102.

## 2011-10-28 NOTE — Progress Notes (Signed)
Cm spoke with MD concerning pt's need for medication assistance. Per pharmacy pt eligible for indigent funds. Pharmacy to fill rx for po phenergan for 3 days. CSW notified of pt due to prior notes stating pt as homeless.    Leonie Green [phone]

## 2011-10-28 NOTE — Discharge Instructions (Signed)
You should return to the ER if you have uncontrolled vomiting.

## 2011-10-28 NOTE — ED Provider Notes (Signed)
History     CSN: [id number]  Arrival date & time 10/28/11  1442   First MD Initiated Contact with Patient 10/28/11 1531      Chief Complaint  Patient presents with  . Emesis    (Consider location/radiation/quality/duration/timing/severity/associated sxs/prior treatment) HPI History provided by pt and prior chart.  Per prior chart, pt discharged from hospital yesterday after treatment for pneumonia.  Returned to ED today w/ c/o N/V.  Had a benign abd on exam and nml Chem 8/urinalysis and d/c'd home w/ phenergan for symptomatic treatment.  Pt returns to the ED because he has vomited three times since leaving the ED today.  He did not fill his prescription for anti-emetic and is unsure why.  No associated chest pain, SOB, abd pain or diarrhea.  He does report feeling feverish.  Nausea has improved spontaneously since arriving in ED.   Past Medical History  Diagnosis Date  . Achilles tendon rupture 01/09/2011  . Radial fracture 11/09/2010  . Polysubstance abuse 04/11/2011  . Hip fracture 08/11/2010  . Urinary retention 04/11/2011  . DVT (deep venous thrombosis) 01/09/2011    right leg per pt  . Paranoid schizophrenia     Past Surgical History  Procedure Date  . Hip surgery     right hip    No family history on file.  History  Substance Use Topics  . Smoking status: Current Everyday Smoker -- 0.5 packs/day for 28 years    Types: Cigarettes  . Smokeless tobacco: Never Used  . Alcohol Use: Yes     3-4 weeks ago, but not sure      Review of Systems  All other systems reviewed and are negative.    Allergies  Review of patient's allergies indicates no known allergies.  Home Medications   Current Outpatient Rx  Name Route Sig Dispense Refill  . CALCIUM CARBONATE-VITAMIN D 500-200 MG-UNIT PO TABS Oral Take 1 tablet by mouth daily.    Marland Kitchen CITALOPRAM HYDROBROMIDE 20 MG PO TABS Oral Take 20 mg by mouth daily.    Marland Kitchen MOXIFLOXACIN HCL 400 MG PO TABS Oral Take 1 tablet (400 mg total)  by mouth daily at 6 PM. 5 tablet 5  . ADULT MULTIVITAMIN W/MINERALS CH Oral Take 1 tablet by mouth daily.    Marland Kitchen PROMETHAZINE HCL 25 MG PO TABS Oral Take 0.5 tablets (12.5 mg total) by mouth every 6 (six) hours as needed for nausea. 30 tablet 0  . RISPERIDONE 1 MG PO TABS Oral Take 1 mg by mouth daily.      BP 120/67  Pulse 91  Temp(Src) 98.4 F (36.9 C) (Oral)  Resp 20  SpO2 98%  Physical Exam  Nursing note and vitals reviewed. Constitutional: He is oriented to person, place, and time. He appears well-developed and well-nourished. No distress.       Pt eating peanut butter crackers  HENT:  Head: Normocephalic and atraumatic.  Eyes:       Normal appearance  Neck: Normal range of motion.  Cardiovascular: Normal rate and regular rhythm.   Pulmonary/Chest: Effort normal and breath sounds normal. No respiratory distress.  Abdominal: Soft. Bowel sounds are normal. He exhibits no distension and no mass. There is no tenderness. There is no rebound and no guarding.  Genitourinary:       No CVA tenderness  Musculoskeletal: Normal range of motion.  Neurological: He is alert and oriented to person, place, and time.  Skin: Skin is warm and dry. No rash noted.  Psychiatric: His behavior is normal. Thought content normal.       Pt has a strange affect.  He stares and is very slow to respond to questions.  Nml mood.     ED Course  Procedures (including critical care time)  Labs Reviewed - No data to display No results found.   1. Nausea and vomiting       MDM  Pt seen in ED this morning for c/o N/V, abd benign on exam, U/A and chem 8 nml and pt d/c'd home w/ prescription for promethazine.  Returns to ED because he continues to vomit; has not filled prescription.  On exam, pt in NAD, eating peanut butter crackers, no vomiting, abd benign and non-tender.  Nursing staff assisted patient in getting his prescription.  Pt d/c'd home.  Return precautions discussed.         Arie Sabina  [place], [place] 10/29/11 1343

## 2011-10-28 NOTE — ED Notes (Signed)
Pt waiting at bus stop and transit called 911- reports pt not acting right.  Pt returned to triage by EMS- Charge RN Patty here to evaluate this pt.  Per [facility]- here for complaint that he was seen here earlier.  Pt admits he did not vomit and that he did take his phenergan- RX bottle here with this pt

## 2011-10-28 NOTE — ED Provider Notes (Signed)
History    56 year old male with nausea and vomiting since this morning. Patient was recently in the hospital and discharged yesterday. Admitted with pneumonia. Was discharged to [facility], a homeless shelter. Patient states that he did not stay there last night. He slept on a bench instead. He denies pain anywhere. No fever or chills. No urinary complaints.   CSN: [id number]  Arrival date & time 10/28/11  0865   First MD Initiated Contact with Patient 10/28/11 0745      Chief Complaint  Patient presents with  . Nausea  . Weakness    (Consider location/radiation/quality/duration/timing/severity/associated sxs/prior treatment) HPI  Past Medical History  Diagnosis Date  . Achilles tendon rupture 01/09/2011  . Radial fracture 11/09/2010  . Polysubstance abuse 04/11/2011  . Hip fracture 08/11/2010  . Urinary retention 04/11/2011  . DVT (deep venous thrombosis) 01/09/2011    right leg per pt  . Paranoid schizophrenia     Past Surgical History  Procedure Date  . Hip surgery     right hip    No family history on file.  History  Substance Use Topics  . Smoking status: Current Everyday Smoker -- 0.5 packs/day for 28 years    Types: Cigarettes  . Smokeless tobacco: Never Used  . Alcohol Use: Yes     3-4 weeks ago, but not sure      Review of Systems   Review of symptoms negative unless otherwise noted in HPI.   Allergies  Review of patient's allergies indicates no known allergies.  Home Medications   Current Outpatient Rx  Name Route Sig Dispense Refill  . MOXIFLOXACIN HCL 400 MG PO TABS Oral Take 1 tablet (400 mg total) by mouth daily at 6 PM. 5 tablet 5    BP 131/72  Pulse 90  Temp(Src) 97.5 F (36.4 C) (Oral)  Resp 18  SpO2 100%  Physical Exam  Nursing note and vitals reviewed. Constitutional: He appears well-developed and well-nourished. No distress.  HENT:  Head: Normocephalic and atraumatic.  Eyes: Conjunctivae are normal. Right eye exhibits no discharge. Left  eye exhibits no discharge.  Neck: Neck supple.  Cardiovascular: Normal rate, regular rhythm and normal heart sounds.  Exam reveals no gallop and no friction rub.   No murmur heard. Pulmonary/Chest: Effort normal and breath sounds normal. No respiratory distress.  Abdominal: Soft. He exhibits no distension. There is no tenderness.  Musculoskeletal: He exhibits no edema and no tenderness.  Neurological: He is alert.       Speech slow and answers short, but answers appropriately. CN 2-12 intact. Strength 5/5 b/l u/l extremities. Movements slow but no dysmetria with finger to nose testing b/l.  Skin: Skin is warm and dry.  Psychiatric: His behavior is normal.       Flat affect    ED Course  Procedures (including critical care time)  Labs Reviewed  URINALYSIS, ROUTINE W REFLEX MICROSCOPIC - Abnormal; Notable for the following:    Protein, ur 30 (*)    All other components within normal limits  POCT I-STAT, CHEM 8 - Abnormal; Notable for the following:    BUN 26 (*)    Glucose, Bld 105 (*)    All other components within normal limits  URINE MICROSCOPIC-ADD ON  LAB REPORT - SCANNED   No results found.   1. Nausea and vomiting       MDM  56 year old male with nausea and vomiting. He is afebrile and hemodynamically stable. He has benign abdominal examination. Very low suspicion  for surgical abdomen. Pt just discharged. Per review of records, pt provided bus pass to homeless shelter which pt chose not to stay at. Apparently given opportunity to fill out paperwork for [facility] during recent hospitalization but chose not to. Pt with odd affect, possibly 2/2 hx of schizophrenia. No SI or HI. No delusions. Suspect some degree of cognitive impairment but do not find reason to IVC at this time or admit for medical reasons. Pt has been provided with resources but seems to choose not to utilize them.       Raeford Razor, MD 10/31/11 [phone]

## 2011-10-28 NOTE — ED Provider Notes (Signed)
History     CSN: [id number]  Arrival date & time 10/28/11  1918   First MD Initiated Contact with Patient 10/28/11 2235      Chief Complaint  Patient presents with  . Nausea  . Emesis    (Consider location/radiation/quality/duration/timing/severity/associated sxs/prior treatment) HPI History provided by pt.   Pt returns to the ED for the third time today; this is the second time I have seen him.  Was seen this morning for N/V and treated symptomatically w/ prescription for phenergan after having nml U/A and I-stat chem 8.  Returned to ED because he continued to vomit, though had not filled his prescription.   VS nml, did not appear dehydrated, abd benign and was tolerating pos.  He received his phenergan and was discharged.  Returns again because "I needed a free place to stay for the night".  Pt is homeless and has no where to go.  His parents live in [place] but have refused to take him in.  Per prior chart, pt discharged from hospital yesterday and it was documented that he refused to fill out his VA benefits paperwork at that time.  Pt reports that he has not had any vomiting since taking his phenergan.  He is not homicidal or suicidal.   Past Medical History  Diagnosis Date  . Achilles tendon rupture 01/09/2011  . Radial fracture 11/09/2010  . Polysubstance abuse 04/11/2011  . Hip fracture 08/11/2010  . Urinary retention 04/11/2011  . DVT (deep venous thrombosis) 01/09/2011    right leg per pt  . Paranoid schizophrenia     Past Surgical History  Procedure Date  . Hip surgery     right hip    No family history on file.  History  Substance Use Topics  . Smoking status: Current Everyday Smoker -- 0.5 packs/day for 28 years    Types: Cigarettes  . Smokeless tobacco: Never Used  . Alcohol Use: Yes     3-4 weeks ago, but not sure      Review of Systems  All other systems reviewed and are negative.    Allergies  Review of patient's allergies indicates no known  allergies.  Home Medications   Current Outpatient Rx  Name Route Sig Dispense Refill  . CALCIUM CARBONATE-VITAMIN D 500-200 MG-UNIT PO TABS Oral Take 1 tablet by mouth daily.    Marland Kitchen CITALOPRAM HYDROBROMIDE 20 MG PO TABS Oral Take 20 mg by mouth daily.    Marland Kitchen MOXIFLOXACIN HCL 400 MG PO TABS Oral Take 1 tablet (400 mg total) by mouth daily at 6 PM. 5 tablet 5  . ADULT MULTIVITAMIN W/MINERALS CH Oral Take 1 tablet by mouth daily.    Marland Kitchen PROMETHAZINE HCL 25 MG PO TABS Oral Take 0.5 tablets (12.5 mg total) by mouth every 6 (six) hours as needed for nausea. 30 tablet 0  . RISPERIDONE 1 MG PO TABS Oral Take 1 mg by mouth daily.      BP 152/87  Pulse 98  Temp(Src) 98.9 F (37.2 C) (Oral)  Resp 18  SpO2 98%  Physical Exam  Nursing note and vitals reviewed. Constitutional: He is oriented to person, place, and time. He appears well-developed and well-nourished. No distress.  HENT:  Head: Normocephalic and atraumatic.  Eyes:       Normal appearance  Neck: Normal range of motion.  Pulmonary/Chest: Effort normal.  Musculoskeletal: Normal range of motion.  Neurological: He is alert and oriented to person, place, and time.  Psychiatric:  Pt continues to have a strange affect.  He stares, is monotone and responds to questions slowly.      ED Course  Procedures (including critical care time)  Labs Reviewed - No data to display No results found.   1. Malingering       MDM  Pt returns for the third time today.  Seen initially for N/V and d/c'd home w/ promethazine for symptomatic treatment.  Returned d/t persisent sx though hadn't filled prescription.  Was eating and drinking, no vomiting and abd benign/non-tender at second visit.  D/c'd home w/ his bottle of promethazine.  Returns again, telling nursing staff that he is still vomiting.  He tells me that he needs a free place to stay for the night.  He is homeless and has no where to go.  Per prior chart, his parents have refused to take  him in and when pt d/c'd from hospital yesterday, he refused to fill out his VA benefits package paperwork.  Pt denies having any vomiting since leaving the ED and he is neither homicidal or suicidal.  D/c'd home again.          Arie Sabina [place], [place] 10/29/11 1347

## 2011-10-28 NOTE — ED Notes (Signed)
CSW notified by CM of pt's recent discharge from emergency department and current homelessness. Per chart review, pt has received resources from [facility] for shelter information and other services. CSW met with pt to determine if any additional services were needed. Pt informed Clinical research associate that he plans to return to the [facility] after his discharge and after he receives his Rx's. Pt requested one bus pass for transportation from Clinical research associate. CSW provided pt's RN with bus pass. No other concerns verbalized to writer at this time.

## 2011-10-28 NOTE — ED Notes (Signed)
Parents number [phone] Tomasa Blase- message left by [employer]

## 2011-10-28 NOTE — Discharge Instructions (Signed)
Nausea and Vomiting Nausea is a sick feeling that often comes before throwing up (vomiting). Vomiting is a reflex where stomach contents come out of your mouth. Vomiting can cause severe loss of body fluids (dehydration). Children and elderly adults can become dehydrated quickly, especially if they also have diarrhea. Nausea and vomiting are symptoms of a condition or disease. It is important to find the cause of your symptoms. CAUSES   Direct irritation of the stomach lining. This irritation can result from increased acid production (gastroesophageal reflux disease), infection, food poisoning, taking certain medicines (such as nonsteroidal anti-inflammatory drugs), alcohol use, or tobacco use.   Signals from the brain.These signals could be caused by a headache, heat exposure, an inner ear disturbance, increased pressure in the brain from injury, infection, a tumor, or a concussion, pain, emotional stimulus, or metabolic problems.   An obstruction in the gastrointestinal tract (bowel obstruction).   Illnesses such as diabetes, hepatitis, gallbladder problems, appendicitis, kidney problems, cancer, sepsis, atypical symptoms of a heart attack, or eating disorders.   Medical treatments such as chemotherapy and radiation.   Receiving medicine that makes you sleep (general anesthetic) during surgery.  DIAGNOSIS Your caregiver may ask for tests to be done if the problems do not improve after a few days. Tests may also be done if symptoms are severe or if the reason for the nausea and vomiting is not clear. Tests may include:  Urine tests.   Blood tests.   Stool tests.   Cultures (to look for evidence of infection).   X-rays or other imaging studies.  Test results can help your caregiver make decisions about treatment or the need for additional tests. TREATMENT You need to stay well hydrated. Drink frequently but in small amounts.You may wish to drink water, sports drinks, clear broth, or  eat frozen ice pops or gelatin dessert to help stay hydrated.When you eat, eating slowly may help prevent nausea.There are also some antinausea medicines that may help prevent nausea. HOME CARE INSTRUCTIONS   Take all medicine as directed by your caregiver.   If you do not have an appetite, do not force yourself to eat. However, you must continue to drink fluids.   If you have an appetite, eat a normal diet unless your caregiver tells you differently.   Eat a variety of complex carbohydrates (rice, wheat, potatoes, bread), lean meats, yogurt, fruits, and vegetables.   Avoid high-fat foods because they are more difficult to digest.   Drink enough water and fluids to keep your urine clear or pale yellow.   If you are dehydrated, ask your caregiver for specific rehydration instructions. Signs of dehydration may include:   Severe thirst.   Dry lips and mouth.   Dizziness.   Dark urine.   Decreasing urine frequency and amount.   Confusion.   Rapid breathing or pulse.  SEEK IMMEDIATE MEDICAL CARE IF:   You have blood or brown flecks (like coffee grounds) in your vomit.   You have black or bloody stools.   You have a severe headache or stiff neck.   You are confused.   You have severe abdominal pain.   You have chest pain or trouble breathing.   You do not urinate at least once every 8 hours.   You develop cold or clammy skin.   You continue to vomit for longer than 24 to 48 hours.   You have a fever.  MAKE SURE YOU:   Understand these instructions.   Will watch your  condition.   Will get help right away if you are not doing well or get worse.  Document Released: 06/19/2005 Document Revised: 06/08/2011 Document Reviewed: 11/16/2010 [facility] Patient Information 2012 [place], [place].  RESOURCE GUIDE  Dental Problems  Patients with Medicaid: [facility] [phone] [address]                                            [phone] Phone:  [phone]                                                  Phone:  [phone]  If unable to pay or uninsured, contact:  [facility] or [facility]. to become qualified for the adult dental clinic.  Chronic Pain Problems Contact [facility]  [phone] Patients need to be referred by their primary care doctor.  Insufficient Money for Medicine Contact United Way:  call "211" or [facility] 629-626-3452.  No Primary Care Doctor Call [facility]  938-090-9109 Other agencies that provide inexpensive medical care    [facility]  ([phone]    [facility]  [phone]    [facility]  ([phone]    [facility]  [phone]    [facility]  [phone]    [facility]  617-716-4910  Psychological Services [facility]  [phone] [facility]  ([phone] [facility]   [phone] (emergency services (450)844-9056)  Substance Abuse Resources [facility]  (913) 442-4693 [facility] 702-509-2985 [facility] [phone] Floydene Flock [phone] Residential & Outpatient Substance Abuse Program  949-698-1312  Abuse/Neglect [facility] Child Abuse Hotline 539-025-4284 [facility] Child Abuse Hotline (214)526-2736 (After Hours)  Emergency Shelter [facility] (269) 030-5396  Maternity [facility] [place] [facility] (432)541-6191 [facility] [phone]  MRSA Hotline #:   (712) 350-9828    [facility] Resources  [facility]     United Way                          [facility] Dept. [address] [place]                       [address]      [address]  [place]                                                Cristobal Goldmann Phone:  [phone]  Phone:  [phone]                 Phone:  [phone]  [facility] Phone:  [phone]  [place] Child Abuse Hotline (336) 342-1394 (336) [phone] (After Hours)   

## 2011-10-28 NOTE — ED Notes (Signed)
Bed:WA03<BR> Expected date:<BR> Expected time:<BR> Means of arrival:<BR> Comments:<BR> EMS

## 2011-10-28 NOTE — ED Notes (Signed)
Made EDPA Georgiann Hahn aware of pt current status and RX phenergan filled ready for disposition

## 2011-10-29 NOTE — ED Notes (Signed)
Pt escorted to lobby via Fayrene Fearing security and [employer],  Whom was on phone trying to call for pt a ride, pt walked entire way to lobby using his cane

## 2011-10-29 NOTE — ED Notes (Signed)
Pt has two bus passes in his possession,

## 2011-10-30 ENCOUNTER — Encounter (HOSPITAL_COMMUNITY): Payer: Self-pay | Admitting: *Deleted

## 2011-10-30 ENCOUNTER — Emergency Department (HOSPITAL_COMMUNITY)
Admission: EM | Admit: 2011-10-30 | Discharge: 2011-10-30 | Disposition: A | Discharge status: Home / Self Care | Payer: Self-pay | Attending: Emergency Medicine | Admitting: Emergency Medicine

## 2011-10-30 DIAGNOSIS — R110 Nausea: Secondary | ICD-10-CM | POA: Insufficient documentation

## 2011-10-30 DIAGNOSIS — Z5900 Homelessness unspecified: Secondary | ICD-10-CM | POA: Insufficient documentation

## 2011-10-30 DIAGNOSIS — F17200 Nicotine dependence, unspecified, uncomplicated: Secondary | ICD-10-CM | POA: Insufficient documentation

## 2011-10-30 DIAGNOSIS — R112 Nausea with vomiting, unspecified: Secondary | ICD-10-CM

## 2011-10-30 DIAGNOSIS — F209 Schizophrenia, unspecified: Secondary | ICD-10-CM | POA: Insufficient documentation

## 2011-10-30 LAB — BASIC METABOLIC PANEL
Anion Gap: 8 (ref 7–16)
Calcium, Total: 9.2 mg/dL (ref 8.5–10.1)
Glucose: 95 mg/dL (ref 65–99)

## 2011-10-30 LAB — CBC
HGB: 11.1 g/dL — ABNORMAL LOW (ref 13.0–18.0)
MCHC: 31.9 g/dL — ABNORMAL LOW (ref 32.0–36.0)
MCV: 94 fL (ref 80–100)
Platelet: 662 10*3/uL — ABNORMAL HIGH (ref 150–440)
RBC: 3.7 10*6/uL — ABNORMAL LOW (ref 4.40–5.90)

## 2011-10-30 MED ORDER — ONDANSETRON 4 MG PO TBDP
4.0000 mg | ORAL_TABLET | Freq: Once | ORAL | Status: AC
  Administered 2011-10-30: 4 mg via ORAL
  Filled 2011-10-30: qty 1

## 2011-10-30 MED ORDER — ONDANSETRON HCL 4 MG PO TABS: 4.0000 mg | ORAL_TABLET | Freq: Four times a day (QID) | ORAL | Status: AC

## 2011-10-30 NOTE — ED Notes (Signed)
Per EMS pt in from [facility] c/o n/v.

## 2011-10-30 NOTE — ED Notes (Signed)
Pt states "the shelter threw me out because I messed up the floor, they gave me clean clothes, fed [place] breakfast and lunch, I have no place to go"

## 2011-10-30 NOTE — Progress Notes (Signed)
Clinical Social Work Department CLINICAL SOCIAL WORK PLACEMENT NOTE 10/30/2011  Patient:  [name],[name]  Account Number:  [id number] Admit date:  10/20/2011  Clinical Social Worker:  Tommi Emery, CLINICAL SOCIAL WORKER  Date/time:  10/26/2011 07:52 AM  Clinical Social Work is seeking post-discharge placement for this patient at the following level of care:      (*CSW will update this form in Epic as items are completed)   10/24/2011  Patient/family provided with [facility] Department of Clinical Social Work's list of facilities offering this level of care within the geographic area requested by the patient (or if unable, by the patient's family).  10/24/2011  Patient/family informed of their freedom to choose among providers that offer the needed level of care, that participate in Medicare, Medicaid or managed care program needed by the patient, have an available bed and are willing to accept the patient.  10/24/2011  Patient/family informed of [facility]' ownership interest in [facility], as well as of the fact that they are under no obligation to receive care at this facility.  PASARR submitted to EDS on 10/24/2011 PASARR number received from EDS on 10/24/2011  FL2 transmitted to all facilities in geographic area requested by pt/family on  10/24/2011 FL2 transmitted to all facilities within larger geographic area on 10/24/2011  Patient informed that his/her managed care company has contracts with or will negotiate with  certain facilities, including the following:     Patient/family informed of bed offers received:  10/25/2011 Patient chooses bed at  Physician recommends and patient chooses bed at    Patient to be transferred to  on   Patient to be transferred to facility by   The following physician request were entered in Epic:   Additional Comments: The pt was not given any bed offers. He was notified that all places declined services.  Kayleen Memos.  Leighton Ruff [phone]

## 2011-10-30 NOTE — Progress Notes (Signed)
Noted self pay pt no pcp with frequent ED visits CM and SW spoke with pt to inquire of a plan for d/c.  Pt assisted to call his father but in conversation did not ask father about staying with him. Pt informed ED CM and SW that his father told him that he knows he is not to be hanging around the hospital. Pt plan of disposition stated was to get "$1.50 to ride the bus to the end of the line Then call 911 to return to hospital"  ED CM and Sw explained to pt that this was not an effective plan of discharge nor a good option.

## 2011-10-30 NOTE — Progress Notes (Signed)
Pt for D/C. Plan is to D/C to [address] in [place].Marland Kitchen Pt is agreeable to plans.  Kayleen Memos. Leighton Ruff [phone]

## 2011-10-30 NOTE — ED Provider Notes (Signed)
History     CSN: [id number]  Arrival date & time 10/30/11  1547   First MD Initiated Contact with Patient 10/30/11 1758      Chief Complaint  Patient presents with  . Nausea    (Consider location/radiation/quality/duration/timing/severity/associated sxs/prior treatment) HPI  56 year old homeless male with history of schizophrenia, history of malingering, and history of polysubstance abuse presents with a chief complaints of nausea. Patient has been presented ED for the fifth time this month. Multiple visits with complaints of nausea. Was prescribed Phenergan several days ago but states he has lost the prescription. Sts he vomited once this AM, none since. Patient denies fever, chills, chest pain shortness of breath, abdominal pain, diarrhea. Patient is homeless. No SI/HI.   Past Medical History  Diagnosis Date  . Achilles tendon rupture 01/09/2011  . Radial fracture 11/09/2010  . Polysubstance abuse 04/11/2011  . Hip fracture 08/11/2010  . Urinary retention 04/11/2011  . DVT (deep venous thrombosis) 01/09/2011    right leg per pt  . Paranoid schizophrenia     Past Surgical History  Procedure Date  . Hip surgery     right hip    No family history on file.  History  Substance Use Topics  . Smoking status: Current Everyday Smoker -- 0.5 packs/day for 28 years    Types: Cigarettes  . Smokeless tobacco: Never Used  . Alcohol Use: Yes     3-4 weeks ago, but not sure      Review of Systems  All other systems reviewed and are negative.    Allergies  Review of patient's allergies indicates no known allergies.  Home Medications   Current Outpatient Rx  Name Route Sig Dispense Refill  . CALCIUM CARBONATE-VITAMIN D 500-200 MG-UNIT PO TABS Oral Take 1 tablet by mouth daily.    Marland Kitchen CITALOPRAM HYDROBROMIDE 20 MG PO TABS Oral Take 20 mg by mouth daily.    Marland Kitchen MOXIFLOXACIN HCL 400 MG PO TABS Oral Take 1 tablet (400 mg total) by mouth daily at 6 PM. 5 tablet 5  . ADULT MULTIVITAMIN  W/MINERALS CH Oral Take 1 tablet by mouth daily.    Marland Kitchen PROMETHAZINE HCL 25 MG PO TABS Oral Take 0.5 tablets (12.5 mg total) by mouth every 6 (six) hours as needed for nausea. 30 tablet 0  . RISPERIDONE 1 MG PO TABS Oral Take 1 mg by mouth daily.      BP 149/74  Pulse 94  Temp(Src) 98.6 F (37 C) (Oral)  Resp 16  Wt 135 lb (61.236 kg)  SpO2 100%  Physical Exam  Nursing note and vitals reviewed. Constitutional: He appears well-developed and well-nourished. No distress.       Awake, alert, nontoxic appearance  HENT:  Head: Atraumatic.       Poor dentition  Eyes: Conjunctivae are normal. Right eye exhibits no discharge. Left eye exhibits no discharge.  Neck: Normal range of motion. Neck supple.  Cardiovascular: Normal rate and regular rhythm.   Pulmonary/Chest: Effort normal. No respiratory distress. He exhibits no tenderness.  Abdominal: Soft. There is no tenderness. There is no rebound.  Musculoskeletal: He exhibits no tenderness.       ROM appears intact, no obvious focal weakness  Neurological: He is alert.  Skin: Skin is warm and dry. No rash noted.  Psychiatric:       Flat affect    ED Course  Procedures (including critical care time)  Labs Reviewed - No data to display No results found.  No diagnosis found.    MDM  Patient with multiple visits to ER for same complaint. Behavioral health has attempt multiple times to provide a home placement for the patient but he has been uncooperative. His only request today is for medication refill for his nausea. I mentioned to patient that I cannot prescribe Phenergan but agrees to give him a prescription for Zofran. Patient voiced understanding and agrees with plan.        Fayrene Helper, PA-C 10/30/11 1844

## 2011-10-30 NOTE — ED Notes (Signed)
CSW met with pt at RN's request. Pt has been to the ED at least 3 times within the last 3 days. Per documentation, pt was discharged from the inpatient floor on Friday 10/27/11 and given instructions to follow up with the [facility] and [employer] for housing assistance and [facility] [place] services for mental health needs. Pt states that today he was at [employer] and "used the bathroom on the floor and was kicked out." CSW questioned the pt as to why he was unable to make it to the restroom pt stated "I don't know, I don't really have a good reason". Pt states that he has the ability to ambulate to the restroom, he just chose to not go to the restroom at the shelter. Pt requests that CSW "get him in [facility] for 2 weeks to kill time". CSW explained that he does not have any insurance or disability to pay for [facility] and asked that the CSW find funds to place him there stating "you did it before." CSW checked with CSW Director, [employer], who states we are not able to Clinical research associate another Letter of Guarantee for placement again.   CSW discussed with the pt alternative plans and discussed returning to the shelter or contacting his parents for assistance. Pt states he is not allowed to return to the shelter "because of what happened" though agreed to call his parents for assistance.   Pt contacted his parents and refused to ask for assistance though reported to CSW that his father warned him that "the police were looking for him because he comes to the hospital all the time." CSW urged the pt to call his family back though he refused and denied CSW permission to call parents back at this time. Pt gave CSW verbal consent to contact [employer] to see if he can return. CSW spoke with BJ Janee Morn at [facility] who stated that the pt "checked him self out of the shelter before calling the EMS." Janee Morn shared that she personally urged pt not to check himself out, reminding him that he can not return for  6 months if he chooses to check out. Janee Morn states at this time, pt will not be eligible for services again for another 6 months.   CSW explained this information to the pt and discussed further plans. Pt repeatedly asks CSW to "give him $1.50 for his clothes so that he can catch the bus to the end of the line." Pt states his plan at that point is to "call EMS and come back to the hospital." CSW reviewed with the pt that EMS and the ED should be used for medical emergencies, for which the pt verbalized understanding.   CSW contacted [employer] who states that the pt can not come due to not having an ID and using a cane. CSW contacted [employer], homeless shelter in [place], and spoke with Wynona Canes who states that the pt is able to come without an ID (show his D/C papers to prove his name) as long as he is willing to get an ID within 7 days. Wynona Canes states that they are also able to accommodate individuals with a cane. Pt is agreeable to this plan. CSW Interior and spatial designer, [employer], has approved a taxi voucher to [employer]. CSW assisted pt in getting into cab. No further CSW needs identified at this time.

## 2011-10-30 NOTE — Discharge Instructions (Signed)
Nausea and Vomiting  Nausea is a sick feeling that often comes before throwing up (vomiting). Vomiting is a reflex where stomach contents come out of your mouth. Vomiting can cause severe loss of body fluids (dehydration). Children and elderly adults can become dehydrated quickly, especially if they also have diarrhea. Nausea and vomiting are symptoms of a condition or disease. It is important to find the cause of your symptoms.  CAUSES    Direct irritation of the stomach lining. This irritation can result from increased acid production (gastroesophageal reflux disease), infection, food poisoning, taking certain medicines (such as nonsteroidal anti-inflammatory drugs), alcohol use, or tobacco use.   Signals from the brain.These signals could be caused by a headache, heat exposure, an inner ear disturbance, increased pressure in the brain from injury, infection, a tumor, or a concussion, pain, emotional stimulus, or metabolic problems.   An obstruction in the gastrointestinal tract (bowel obstruction).   Illnesses such as diabetes, hepatitis, gallbladder problems, appendicitis, kidney problems, cancer, sepsis, atypical symptoms of a heart attack, or eating disorders.   Medical treatments such as chemotherapy and radiation.   Receiving medicine that makes you sleep (general anesthetic) during surgery.  DIAGNOSIS  Your caregiver may ask for tests to be done if the problems do not improve after a few days. Tests may also be done if symptoms are severe or if the reason for the nausea and vomiting is not clear. Tests may include:   Urine tests.   Blood tests.   Stool tests.   Cultures (to look for evidence of infection).   X-rays or other imaging studies.  Test results can help your caregiver make decisions about treatment or the need for additional tests.  TREATMENT  You need to stay well hydrated. Drink frequently but in small amounts.You may wish to drink water, sports drinks, clear broth, or eat frozen  ice pops or gelatin dessert to help stay hydrated.When you eat, eating slowly may help prevent nausea.There are also some antinausea medicines that may help prevent nausea.  HOME CARE INSTRUCTIONS    Take all medicine as directed by your caregiver.   If you do not have an appetite, do not force yourself to eat. However, you must continue to drink fluids.   If you have an appetite, eat a normal diet unless your caregiver tells you differently.   Eat a variety of complex carbohydrates (rice, wheat, potatoes, bread), lean meats, yogurt, fruits, and vegetables.   Avoid high-fat foods because they are more difficult to digest.   Drink enough water and fluids to keep your urine clear or pale yellow.   If you are dehydrated, ask your caregiver for specific rehydration instructions. Signs of dehydration may include:   Severe thirst.   Dry lips and mouth.   Dizziness.   Dark urine.   Decreasing urine frequency and amount.   Confusion.   Rapid breathing or pulse.  SEEK IMMEDIATE MEDICAL CARE IF:    You have blood or brown flecks (like coffee grounds) in your vomit.   You have black or bloody stools.   You have a severe headache or stiff neck.   You are confused.   You have severe abdominal pain.   You have chest pain or trouble breathing.   You do not urinate at least once every 8 hours.   You develop cold or clammy skin.   You continue to vomit for longer than 24 to 48 hours.   You have a fever.  MAKE SURE YOU:      Understand these instructions.   Will watch your condition.   Will get help right away if you are not doing well or get worse.  Document Released: 06/19/2005 Document Revised: 06/08/2011 Document Reviewed: 11/16/2010  [employer] Patient Information 2012 [employer].

## 2011-10-31 LAB — TSH: Thyroid Stimulating Horm: 3.15 u[IU]/mL

## 2011-10-31 LAB — ETHANOL
Ethanol %: <0.003 % (ref 0.000–0.080)
Ethanol: <3 mg/dL

## 2011-10-31 LAB — DRUG SCREEN, URINE
Barbiturates, Ur Screen: NEGATIVE (ref ?–200)
Cannabinoid 50 Ng, Ur ~~LOC~~: NEGATIVE (ref ?–50)
Methadone, Ur Screen: NEGATIVE (ref ?–300)
Phencyclidine (PCP) Ur S: NEGATIVE (ref ?–25)
Tricyclic, Ur Screen: NEGATIVE (ref ?–1000)

## 2011-10-31 LAB — SALICYLATE LEVEL: Salicylates, Serum: <1.7 mg/dL

## 2011-10-31 LAB — ACETAMINOPHEN LEVEL: Acetaminophen: <2 ug/mL

## 2011-10-31 NOTE — ED Provider Notes (Signed)
Medical screening examination/treatment/procedure(s) were performed by non-physician practitioner and as supervising physician I was immediately available for consultation/collaboration.   Rolan Bucco, MD 10/31/11 [phone]

## 2011-11-01 ENCOUNTER — Observation Stay: Payer: Self-pay | Admitting: Student

## 2011-11-01 LAB — CK: CK, Total: 245 U/L — ABNORMAL HIGH (ref 35–232)

## 2011-11-04 ENCOUNTER — Emergency Department: Payer: Self-pay | Admitting: Internal Medicine

## 2011-11-04 LAB — URINALYSIS, COMPLETE
Bacteria: NEGATIVE
Bilirubin,UR: NEGATIVE
Leukocyte Esterase: NEGATIVE
Ph: 7 (ref 4.5–8.0)
Squamous Epithelial: NONE SEEN
WBC UR: NONE SEEN /HPF (ref 0–5)

## 2011-11-04 LAB — COMPREHENSIVE METABOLIC PANEL
Anion Gap: 9 (ref 7–16)
Bilirubin,Total: 0.3 mg/dL (ref 0.2–1.0)
Calcium, Total: 9.7 mg/dL (ref 8.5–10.1)
Chloride: 101 mmol/L (ref 98–107)
Co2: 28 mmol/L (ref 21–32)
Creatinine: 0.88 mg/dL (ref 0.60–1.30)
Glucose: 95 mg/dL (ref 65–99)

## 2011-11-04 LAB — ETHANOL: Ethanol %: <0.003 % (ref 0.000–0.080)

## 2011-11-04 LAB — DRUG SCREEN, URINE
Benzodiazepine, Ur Scrn: NEGATIVE (ref ?–200)
Methadone, Ur Screen: NEGATIVE (ref ?–300)
Phencyclidine (PCP) Ur S: NEGATIVE (ref ?–25)
Tricyclic, Ur Screen: NEGATIVE (ref ?–1000)

## 2011-11-04 LAB — CBC
MCH: 30.5 pg (ref 26.0–34.0)
RDW: 13.9 % (ref 11.5–14.5)
WBC: 12.6 10*3/uL — ABNORMAL HIGH (ref 3.8–10.6)

## 2011-11-04 LAB — SALICYLATE LEVEL: Salicylates, Serum: <1.7 mg/dL

## 2011-11-04 NOTE — ED Provider Notes (Signed)
Medical screening examination/treatment/procedure(s) were performed by non-physician practitioner and as supervising physician I was immediately available for consultation/collaboration.  Saxon Crosby, MD 11/04/11 0819 

## 2011-11-04 NOTE — ED Provider Notes (Signed)
Medical screening examination/treatment/procedure(s) were performed by non-physician practitioner and as supervising physician I was immediately available for consultation/collaboration.  Cyndra Numbers, MD 11/04/11 [phone]

## 2012-01-02 ENCOUNTER — Encounter (HOSPITAL_COMMUNITY): Payer: Self-pay | Admitting: *Deleted

## 2012-01-02 ENCOUNTER — Emergency Department (HOSPITAL_COMMUNITY)
Admission: EM | Admit: 2012-01-02 | Discharge: 2012-01-02 | Disposition: A | Discharge status: Home / Self Care | Payer: Self-pay | Attending: Emergency Medicine | Admitting: Emergency Medicine

## 2012-01-02 ENCOUNTER — Emergency Department (HOSPITAL_COMMUNITY)
Admission: EM | Admit: 2012-01-02 | Discharge: 2012-01-03 | Disposition: A | Discharge status: Home / Self Care | Payer: Self-pay | Attending: Emergency Medicine | Admitting: Emergency Medicine

## 2012-01-02 DIAGNOSIS — F17200 Nicotine dependence, unspecified, uncomplicated: Secondary | ICD-10-CM | POA: Insufficient documentation

## 2012-01-02 DIAGNOSIS — Z86718 Personal history of other venous thrombosis and embolism: Secondary | ICD-10-CM | POA: Insufficient documentation

## 2012-01-02 DIAGNOSIS — F200 Paranoid schizophrenia: Secondary | ICD-10-CM | POA: Insufficient documentation

## 2012-01-02 DIAGNOSIS — IMO0001 Reserved for inherently not codable concepts without codable children: Secondary | ICD-10-CM

## 2012-01-02 DIAGNOSIS — F1910 Other psychoactive substance abuse, uncomplicated: Secondary | ICD-10-CM | POA: Insufficient documentation

## 2012-01-02 DIAGNOSIS — Z0289 Encounter for other administrative examinations: Secondary | ICD-10-CM | POA: Insufficient documentation

## 2012-01-02 DIAGNOSIS — Z139 Encounter for screening, unspecified: Secondary | ICD-10-CM

## 2012-01-02 DIAGNOSIS — Z765 Malingerer [conscious simulation]: Secondary | ICD-10-CM

## 2012-01-02 DIAGNOSIS — Z5900 Homelessness unspecified: Secondary | ICD-10-CM | POA: Insufficient documentation

## 2012-01-02 DIAGNOSIS — R112 Nausea with vomiting, unspecified: Secondary | ICD-10-CM | POA: Insufficient documentation

## 2012-01-02 DIAGNOSIS — R110 Nausea: Secondary | ICD-10-CM | POA: Insufficient documentation

## 2012-01-02 DIAGNOSIS — R1110 Vomiting, unspecified: Secondary | ICD-10-CM | POA: Insufficient documentation

## 2012-01-02 DIAGNOSIS — Z8781 Personal history of (healed) traumatic fracture: Secondary | ICD-10-CM | POA: Insufficient documentation

## 2012-01-02 LAB — COMPREHENSIVE METABOLIC PANEL
Albumin: 4.7 g/dL (ref 3.5–5.2)
Alkaline Phosphatase: 122 U/L — ABNORMAL HIGH (ref 39–117)
BUN: 17 mg/dL (ref 6–23)
Calcium: 10.3 mg/dL (ref 8.4–10.5)
GFR calc Af Amer: >90 mL/min (ref >90)
Glucose, Bld: 89 mg/dL (ref 70–99)
Potassium: 4.8 mEq/L (ref 3.5–5.1)
Sodium: 140 mEq/L (ref 135–145)
Total Protein: 9 g/dL — ABNORMAL HIGH (ref 6.0–8.3)

## 2012-01-02 LAB — COMPREHENSIVE METABOLIC PANEL WITH GFR
ALT: 18 U/L (ref 0–53)
AST: 39 U/L — ABNORMAL HIGH (ref 0–37)
CO2: 25 meq/L (ref 19–32)
Chloride: 97 meq/L (ref 96–112)
Creatinine, Ser: 0.96 mg/dL (ref 0.50–1.35)
GFR calc non Af Amer: >90 mL/min (ref >90)
Total Bilirubin: 0.6 mg/dL (ref 0.3–1.2)

## 2012-01-02 LAB — CBC WITH DIFFERENTIAL/PLATELET
Basophils Absolute: 0.1 K/uL (ref 0.0–0.1)
Basophils Relative: 0 % (ref 0–1)
Eosinophils Absolute: 0.1 10*3/uL (ref 0.0–0.7)
Eosinophils Relative: 1 % (ref 0–5)
HCT: 42.1 % (ref 39.0–52.0)
Hemoglobin: 14 g/dL (ref 13.0–17.0)
Lymphocytes Relative: 26 % (ref 12–46)
Lymphs Abs: 3.9 K/uL (ref 0.7–4.0)
MCH: 30.9 pg (ref 26.0–34.0)
MCHC: 33.3 g/dL (ref 30.0–36.0)
MCV: 92.9 fL (ref 78.0–100.0)
Monocytes Absolute: 1.2 K/uL — ABNORMAL HIGH (ref 0.1–1.0)
Monocytes Relative: 8 % (ref 3–12)
Neutro Abs: 9.6 K/uL — ABNORMAL HIGH (ref 1.7–7.7)
Neutrophils Relative %: 65 % (ref 43–77)
Platelets: 721 10*3/uL — ABNORMAL HIGH (ref 150–400)
RBC: 4.53 MIL/uL (ref 4.22–5.81)
RDW: 14.6 % (ref 11.5–15.5)
WBC: 14.9 K/uL — ABNORMAL HIGH (ref 4.0–10.5)

## 2012-01-02 LAB — URINALYSIS, ROUTINE W REFLEX MICROSCOPIC
Glucose, UA: NEGATIVE mg/dL
Hgb urine dipstick: NEGATIVE
Ketones, ur: 15 mg/dL — AB
Nitrite: NEGATIVE
Protein, ur: 30 mg/dL — AB
Specific Gravity, Urine: 1.022 (ref 1.005–1.030)
Urobilinogen, UA: 1 mg/dL (ref 0.0–1.0)
pH: 7 (ref 5.0–8.0)

## 2012-01-02 LAB — RAPID URINE DRUG SCREEN, HOSP PERFORMED
Amphetamines: NOT DETECTED
Barbiturates: NOT DETECTED
Benzodiazepines: NOT DETECTED
Cocaine: NOT DETECTED
Opiates: NOT DETECTED
Tetrahydrocannabinol: NOT DETECTED

## 2012-01-02 LAB — ETHANOL: Alcohol, Ethyl (B): <5 mg/dL (ref 0–11)

## 2012-01-02 LAB — URINE MICROSCOPIC-ADD ON

## 2012-01-02 NOTE — ED Notes (Signed)
Pt with unusual affect...talking to someone not in the room. Unwilling to speak with EDP. States he is not here for anything and he is ready to leave.

## 2012-01-02 NOTE — ED Notes (Signed)
Pt appears in a very poor nutritional state, ill-kept with soiled clothing. Skin in poor condition, poor hygine and self care.  Presents with c/o n/v. Pt well known to this ED, and  Child psychotherapist M. Manson Passey. Pt has been assisted multiple times with access to community resources and has his parents who offer him housing, in town. Yet, he refuses to follow through with the assistance offered, and goes from hospital to hospital asking for assistance. Pt was provided housing in [place], but refused to stay. Was housed her in [place], but left and immediately called 911 to be transported to the ED.

## 2012-01-02 NOTE — ED Notes (Signed)
Pt brought in by ems. Pt was ambulatory. [facility] and called ems. Pt states he wants to eat and wants a ride. Pt said he is nauseated but wants to eat."

## 2012-01-02 NOTE — ED Notes (Signed)
Vomiting today no nv or diarrhea

## 2012-01-02 NOTE — ED Notes (Signed)
Pt states he was at [facility] for n/v. Pt state  N/v has not abdominal gotten better. Pt states he would like a hamburger in triage. Pt states he is hungry but also states " I think i am nauseated and need peanut butter and jelly sandwich." pt denies any abdominal pain

## 2012-01-02 NOTE — ED Provider Notes (Signed)
History     CSN: [id number]  Arrival date & time 01/02/12  1753  6:02 PM HPI Pt has called EMS and returned to the ED. I asked if he still feels fine and he states yes. Reports called EMS but they did not take him where he wanted to go so now is requesting a bus pass.   The history is provided by the patient.    Past Medical History  Diagnosis Date  . Achilles tendon rupture 01/09/2011  . Radial fracture 11/09/2010  . Polysubstance abuse 04/11/2011  . Hip fracture 08/11/2010  . Urinary retention 04/11/2011  . DVT (deep venous thrombosis) 01/09/2011    right leg per pt  . Paranoid schizophrenia     Past Surgical History  Procedure Date  . Hip surgery     right hip    No family history on file.  History  Substance Use Topics  . Smoking status: Current Everyday Smoker -- 0.5 packs/day for 28 years    Types: Cigarettes  . Smokeless tobacco: Never Used  . Alcohol Use: Yes     3-4 weeks ago, but not sure      Review of Systems  All other systems reviewed and are negative.    Allergies  Review of patient's allergies indicates no known allergies.  Home Medications  No current outpatient prescriptions on file.  There were no vitals taken for this visit.  Physical Exam  Constitutional: He is oriented to person, place, and time. He appears well-developed and well-nourished.  HENT:  Head: Normocephalic and atraumatic.  Eyes: Pupils are equal, round, and reactive to light.  Neurological: He is alert and oriented to person, place, and time.  Skin: Skin is warm and dry. No rash noted. No erythema. No pallor.  Psychiatric: He has a normal mood and affect. His behavior is normal.    ED Course  Procedures  MDM  Pt openly admitting to coming to ED just for a bus pass. Patient advised that EMS is not for personal transportation. Pt states ready for d/c. Will discharge      Thomasene Lot, PA-C 01/02/12 1842
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
## 2012-01-02 NOTE — ED Provider Notes (Signed)
Medical screening examination/treatment/procedure(s) were performed by non-physician practitioner and as supervising physician I was immediately available for consultation/collaboration.  Raeford Razor, MD 01/02/12 2348

## 2012-01-02 NOTE — ED Provider Notes (Signed)
History     CSN: [id number]  Arrival date & time 01/02/12  1324   First MD Initiated Contact with Patient 01/02/12 1421     3:17 PM HPI According to triage note patient has nausea. I asked the patient several times while in his room why he has come to the ED. He repeated asks if he can remove his gown. Then patient begins flipping channels on the TV. I asked him to turn it off which he agreed to but immediately turns it back on. Asked if he is feeling fine today and he agrees. States he is ready for d/c  The history is provided by the patient.    Past Medical History  Diagnosis Date  . Achilles tendon rupture 01/09/2011  . Radial fracture 11/09/2010  . Polysubstance abuse 04/11/2011  . Hip fracture 08/11/2010  . Urinary retention 04/11/2011  . DVT (deep venous thrombosis) 01/09/2011    right leg per pt  . Paranoid schizophrenia     Past Surgical History  Procedure Date  . Hip surgery     right hip    History reviewed. No pertinent family history.  History  Substance Use Topics  . Smoking status: Current Everyday Smoker -- 0.5 packs/day for 28 years    Types: Cigarettes  . Smokeless tobacco: Never Used  . Alcohol Use: Yes     3-4 weeks ago, but not sure      Review of Systems  Gastrointestinal: Negative for nausea, vomiting and abdominal pain.  All other systems reviewed and are negative.    Allergies  Review of patient's allergies indicates no known allergies.  Home Medications  No current outpatient prescriptions on file.  BP 134/72  Pulse 87  Temp 97.6 F (36.4 C) (Oral)  Resp 20  SpO2 100%  Physical Exam  Constitutional: He is oriented to person, place, and time. He appears well-developed and well-nourished.  HENT:  Head: Normocephalic and atraumatic.  Eyes: Pupils are equal, round, and reactive to light.  Neurological: He is alert and oriented to person, place, and time.  Skin: Skin is warm and dry. No rash noted. No erythema. No pallor.  Psychiatric:  He has a normal mood and affect. His behavior is normal.    ED Course  Procedures  MDM   Pt has had similar symptoms and visits to the ED. Apparently has been given multiple resources but pt will not follow- through with them.  Currently has no complaints and states he is ready to go. Will d/c      Thomasene Lot, PA-C 01/02/12 1622

## 2012-01-02 NOTE — Progress Notes (Signed)
Pt listed as self pay with no insurance coverage Pt confirms he is self pay [employer] resident [employer] and [facility] community liaison spoke with him Pt offered [facility] services to assist with finding a [place] self pay provider Pt accepted information

## 2012-01-02 NOTE — Progress Notes (Signed)
Pt seen by ED CM and SW on frequent [facility] ED visits. Pt has been noted not to follow up on various resources offered.

## 2012-01-03 ENCOUNTER — Encounter (HOSPITAL_COMMUNITY): Payer: Self-pay | Admitting: *Deleted

## 2012-01-03 ENCOUNTER — Emergency Department (HOSPITAL_COMMUNITY)
Admission: EM | Admit: 2012-01-03 | Discharge: 2012-01-03 | Discharge status: Against Medical Advice | Payer: Self-pay | Attending: Emergency Medicine | Admitting: Emergency Medicine

## 2012-01-03 ENCOUNTER — Emergency Department (HOSPITAL_COMMUNITY)
Admission: EM | Admit: 2012-01-03 | Discharge: 2012-01-03 | Disposition: A | Discharge status: Home / Self Care | Payer: Self-pay | Attending: Emergency Medicine | Admitting: Emergency Medicine

## 2012-01-03 ENCOUNTER — Encounter (HOSPITAL_COMMUNITY): Payer: Self-pay | Admitting: Emergency Medicine

## 2012-01-03 DIAGNOSIS — Z86718 Personal history of other venous thrombosis and embolism: Secondary | ICD-10-CM | POA: Insufficient documentation

## 2012-01-03 DIAGNOSIS — R109 Unspecified abdominal pain: Secondary | ICD-10-CM | POA: Insufficient documentation

## 2012-01-03 DIAGNOSIS — R079 Chest pain, unspecified: Secondary | ICD-10-CM | POA: Insufficient documentation

## 2012-01-03 MED ORDER — ACETAMINOPHEN 325 MG PO TABS
ORAL_TABLET | ORAL | Status: AC
  Filled 2012-01-03: qty 2

## 2012-01-03 MED ORDER — ACETAMINOPHEN 325 MG PO TABS
650.0000 mg | ORAL_TABLET | Freq: Once | ORAL | Status: AC
  Administered 2012-01-03: 650 mg via ORAL
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
## 2012-01-03 NOTE — ED Notes (Signed)
The patient advised he is going to wait in the lobby until 0800 at which time he will ask to speak with the social worker.

## 2012-01-03 NOTE — ED Provider Notes (Addendum)
History     CSN: [id number]  Arrival date & time 01/02/12  2054   First MD Initiated Contact with Patient 01/03/12 0013      Chief Complaint  Patient presents with  . Emesis    (Consider location/radiation/quality/duration/timing/severity/associated sxs/prior treatment) HPI Comments: Pt reports to me he is homeless.  He asks for something to eat.  I ask why he came to the ED tonight and he tells me it was to get something to eat.  Of note, it has been raining all day today.  Prior records shows he was at [facility] multiple times today for non emergent issues it seems.    Patient is a 55 y.o. male presenting with vomiting. The history is provided by the patient.  Emesis     Past Medical History  Diagnosis Date  . Achilles tendon rupture 01/09/2011  . Radial fracture 11/09/2010  . Polysubstance abuse 04/11/2011  . Hip fracture 08/11/2010  . Urinary retention 04/11/2011  . DVT (deep venous thrombosis) 01/09/2011    right leg per pt  . Paranoid schizophrenia     Past Surgical History  Procedure Date  . Hip surgery     right hip    History reviewed. No pertinent family history.  History  Substance Use Topics  . Smoking status: Current Everyday Smoker -- 0.5 packs/day for 28 years    Types: Cigarettes  . Smokeless tobacco: Never Used  . Alcohol Use: Yes     3-4 weeks ago, but not sure      Review of Systems  Gastrointestinal: Positive for vomiting.    Allergies  Review of patient's allergies indicates no known allergies.  Home Medications  No current outpatient prescriptions on file.  BP 147/64  Pulse 82  Temp 97.9 F (36.6 C) (Oral)  Resp 15  Ht 5' 10.5" (1.791 m)  Wt 135 lb (61.236 kg)  BMI 19.10 kg/m2  SpO2 100%  Physical Exam  Vitals reviewed. Constitutional: He appears well-developed. No distress.  Pulmonary/Chest: Effort normal. No respiratory distress.  Neurological: He is alert.  Skin: Skin is warm.    ED Course  Procedures (including critical care  time)  Labs Reviewed  CBC WITH DIFFERENTIAL - Abnormal; Notable for the following:    WBC 14.9 (*)     Platelets 721 (*)     Neutro Abs 9.6 (*)     Monocytes Absolute 1.2 (*)     All other components within normal limits  COMPREHENSIVE METABOLIC PANEL - Abnormal; Notable for the following:    Total Protein 9.0 (*)     AST 39 (*)  HEMOLYSIS AT THIS LEVEL MAY AFFECT RESULT   Alkaline Phosphatase 122 (*)  HEMOLYSIS AT THIS LEVEL MAY AFFECT RESULT   All other components within normal limits  URINALYSIS, ROUTINE W REFLEX MICROSCOPIC - Abnormal; Notable for the following:    APPearance CLOUDY (*)     Bilirubin Urine SMALL (*)     Ketones, ur 15 (*)     Protein, ur 30 (*)     Leukocytes, UA TRACE (*)     All other components within normal limits  ETHANOL  URINE RAPID DRUG SCREEN (HOSP PERFORMED)  URINE MICROSCOPIC-ADD ON   No results found.   1. Screening     VS and RA sat are normal.  Labs were done in triage.  WBC up is non specific. LFT's are up which I suspect may be due to his prior h/o substance abuse.  I think ok  for outpt follow up.     MDM  No emergent condition.  Pt informed inappropriate to come to the ED for food.  Will d/c home.        Gavin Pound. Oletta Lamas, MD 01/03/12 Perlie Mayo  Gavin Pound. Oletta Lamas, MD 01/03/12 6962

## 2012-01-03 NOTE — Discharge Instructions (Signed)
RESOURCE GUIDE  Chronic Pain Problems: Contact [facility]  [phone] Patients need to be referred by their primary care doctor.  Insufficient Money for Medicine: Contact United Way:  call "211" or [facility] [phone].  No Primary Care Doctor: - Call [facility]  [phone] - can help you locate a primary care doctor that  accepts your insurance, provides certain services, etc. - Physician Referral Service- 1-800-533-3463  Agencies that provide inexpensive medical care: - [facility]  [phone] - [facility]  [phone] - [facility]  [phone] - [facility]  [phone] - Planned Parenthood  [phone] - [facility]  [phone]  Medicaid-accepting [place] Providers: - [facility]- [address]  [phone], Mon-Fri 9am-7pm, Sat 9am-1pm - [facility]- [address], [address]  [phone] - [facility]- [address], [address]  [phone] - [facility]- [address] [place] [address]  [phone] - Veita Bland- [address], [phone]  Only accepts [place] Access Medicaid patients after they have their name  applied to their card  Self Pay (no insurance) in [place]: - Sickle Cell Patients: Dr Eric Dean, [facility]  [address], [phone] - [facility]- [address]  [phone]       -     [facility]- [address]       -     [facility]- see information above (Speak to Pam H if you do not have insurance)       -  [facility]- [address], [phone]       -  [facility] [place]- [address],  [phone]       -  [facility]- [address] [place] [address], [phone]       -  Dr Osei-Bonsu-  [address], [place], [phone]       -  [facility]- [address], [phone]       -  [facility]- [address] [place] [address], [phone], also [address], [phone]       -    [facility]- [address], [phone], 1st & 3rd Saturday   every month, 10am-1pm  1) Find a Doctor and Pay Out of Pocket Although you won't have to find out who is covered by your insurance plan, it is a good idea to ask around and get recommendations. You will then need to call the office and see if the doctor you have chosen will accept you as a new patient and what types of options they offer for patients who are self-pay. Some doctors offer discounts or will set up payment plans for their patients who do not have insurance, but you will need to ask so you aren't surprised when you get to your appointment.  2) Contact Your Local Health Department Not all health departments have doctors that can see patients for sick visits, but many do, so it is worth a call to see if yours does. If you don't know where your local health department is, you can check in your phone book. The CDC also has a tool to help you locate your state's health department, and many state websites also have   listings of all of their local health departments.  3) Find a Walk-in Clinic If your illness is not likely to be very severe or complicated, you may want to try a walk in clinic. These are popping up all over the country in pharmacies, drugstores, and shopping centers. They're usually staffed by nurse practitioners or physician assistants that have been trained to treat common illnesses and complaints. They're usually fairly quick and inexpensive. However, if you have serious medical issues or chronic medical problems, these are probably not your best option  STD Testing - [place] [facility] [place], [facility], [address], [place], phone [phone] or 1-877-539-9860.  Monday - Friday, call for an appointment. - [place]  [facility] [place], [facility] Clinic, [address], [place], phone [phone] or 1-877-539-9860.  Monday - Friday, call for an appointment.  Abuse/Neglect: - [place] Child Abuse Hotline (336) 641-3795 - [place] Child Abuse Hotline 800-378-5315 (After Hours)  Emergency Shelter:  [facility] (336) 271-5985  Maternity Homes: - [facility] (336) 275-9566 - [facility] (704) 372-4663  MRSA Hotline #:   832-7006  [place] Resources  [facility] [place]  [employer] [place] [facility] [address]                 [address]         [address]  [place]                                               [place]                              [place] Phone:  [phone]                                  Phone:  [phone]                   Phone:  [phone]  [place] [facility], [phone] - [place] Services - [facility]- 1-888-581-9988       -     [facility] in [place], [address],                                  336-349-4454, Insurance  [place] Child Abuse Hotline (336) 342-1394 or (336) 342-3537 (After Hours)   Behavioral Health Services  Substance Abuse Resources: - [facility]  336-882-2125 - [facility] 336-784-9470 - [facility] 336-285-9073 - [facility] 336-845-3988 - Residential & Outpatient Substance Abuse Program  800-659-3381  Psychological Services: - [facility]  [phone] - [facility]  [phone] - [place] [facility], [address], [place], ACCESS LINE: 1-800-853-5163 or 336-641-4981, Http://www.[facility].com/services/adult.htm  Dental Assistance  If unable to pay or uninsured, contact:  [facility] or [place] [facility] to become qualified for the adult dental  clinic.  Patients with Medicaid: [facility] [address], [phone], [phone]  If unable   to pay, or uninsured, contact [facility] ([phone]) or [place] [facility] ([phone] in [place], [phone] in [place]) to become qualified for the adult dental clinic  Other Low-Cost Community Dental Services: - [facility]- [address], [place], [place], [address], [phone], [address]. [address], 2nd and 4th Thursday of the month at 6:30am.  10 clients each day by appointment, can sometimes see walk-in patients if someone does not show for an appointment. - [facility]- [address], [place], [place], [address], [phone] - [facility]- [address], [place], [place], [address], [phone] - [place] [facility]- [phone] - [facility]- [phone] - [facility]- [phone]    

## 2012-01-03 NOTE — ED Provider Notes (Signed)
History     CSN: [id number]  Arrival date & time 01/03/12  0719   First MD Initiated Contact with Patient 01/03/12 [phone]      Chief Complaint  Patient presents with  . Chest Pain    (Consider location/radiation/quality/duration/timing/severity/associated sxs/prior treatment) Patient is a 56 y.o. male presenting with chest pain. The history is provided by the patient and medical records.  Chest Pain Pertinent negatives for primary symptoms include no fever, no shortness of breath, no cough, no abdominal pain, no nausea and no vomiting.    the patient is a 56 year old, male, who smokes cigarettes.  Complains of central chest pain, with no other symptoms associated with it.  When I asked him to describe his chest.  Pain more completely.  He, says I don't know.  Specifically, he denies nausea, vomiting, fevers, chills, cough, shortness of breath.  He denies leg pain or swelling.  He states that he has never had similar symptoms in the past.  Also of importance is the fact that the patient was seen in the emergency department yesterday and early this morning.  For a total of 3 times.  On each of those times.  He had very nonspecific and veriable complaints.  Past Medical History  Diagnosis Date  . Achilles tendon rupture 01/09/2011  . Radial fracture 11/09/2010  . Polysubstance abuse 04/11/2011  . Hip fracture 08/11/2010  . Urinary retention 04/11/2011  . DVT (deep venous thrombosis) 01/09/2011    right leg per pt  . Paranoid schizophrenia     Past Surgical History  Procedure Date  . Hip surgery     right hip    No family history on file.  History  Substance Use Topics  . Smoking status: Current Everyday Smoker -- 0.5 packs/day for 28 years    Types: Cigarettes  . Smokeless tobacco: Never Used  . Alcohol Use: Yes     3-4 weeks ago, but not sure      Review of Systems  Constitutional: Negative for fever and chills.  Respiratory: Negative for cough and shortness of breath.     Cardiovascular: Positive for chest pain.  Gastrointestinal: Negative for nausea, vomiting and abdominal pain.  Neurological: Negative for headaches.  Psychiatric/Behavioral: Negative for confusion.  All other systems reviewed and are negative.    Allergies  Review of patient's allergies indicates no known allergies.  Home Medications  No current outpatient prescriptions on file.  BP 135/71  Pulse 78  Temp 97.9 F (36.6 C) (Oral)  Resp 16  SpO2 100%  Physical Exam  Nursing note and vitals reviewed. Constitutional: He is oriented to person, place, and time. No distress.       Disheveled unkempt  HENT:  Head: Normocephalic and atraumatic.  Eyes: Conjunctivae are normal.  Neck: Normal range of motion. Neck supple.  Cardiovascular: Normal rate.   No murmur heard. Pulmonary/Chest: Effort normal. No respiratory distress.  Abdominal: Soft. There is no tenderness.  Musculoskeletal: Normal range of motion.  Neurological: He is alert and oriented to person, place, and time.  Skin: Skin is warm and dry.  Psychiatric: He has a normal mood and affect. Thought content normal.    ED Course  Procedures (including critical care time)  Labs Reviewed - No data to display No results found.   1. Chest pain     ECG Normal sinus rhythm with heart rate 79 beats per minute. Normal axis. Normal intervals.   MDM  Chest pain  Cheri Guppy, MD 01/03/12 [phone]

## 2012-01-03 NOTE — ED Notes (Signed)
The patient is AOx4 and comfortable with his discharge instructions. 

## 2012-01-03 NOTE — ED Notes (Signed)
Pt c/o cp 10/10. Unsure of what it feels like or if he has had this pain before. Lung sounds clear. Heart normal. No swelling or edema noted. Pt eyes appearing red. Pt alert. Vitals stable. Asking for bus pass

## 2012-01-06 IMAGING — CT CT HEAD W/O CM
1 series · 16 of 30 positions shown, 20 images · non-contrast
Comparison: None.

CLINICAL DATA: Weakness.  Unsteady gait.  History of EtOH abuse.

CT HEAD WITHOUT CONTRAST
TECHNIQUE: Contiguous axial images were obtained from the base of
the skull through the vertex without contrast.

[Series 2: head_seq 4.5 h37s st · axial · 0.45mm/px · z∈[-129,+19]mm · 16 of 36 slices shown, 20 images]
[im 2/36  brain]
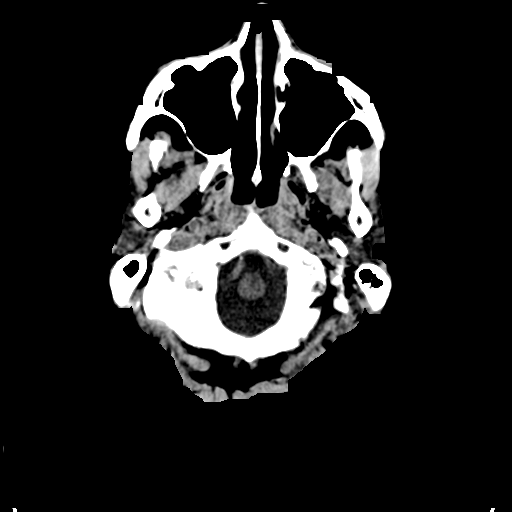
[im 2/36  bone]
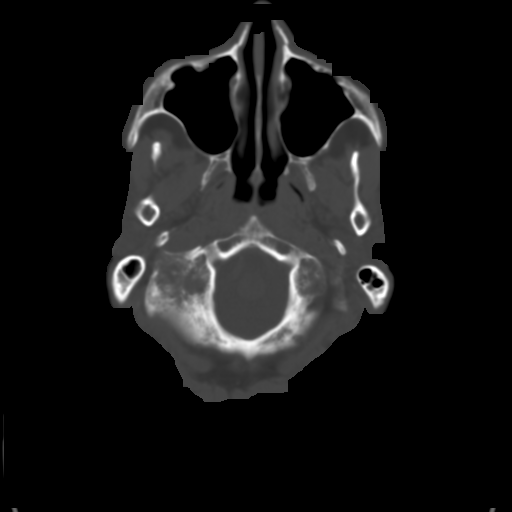
[im 4/36  brain]
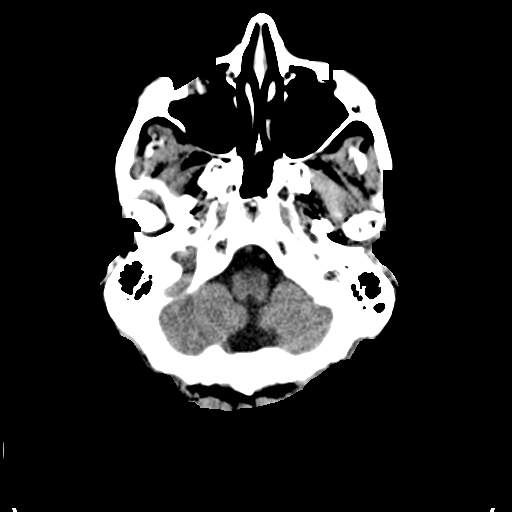
[im 7/36  brain]
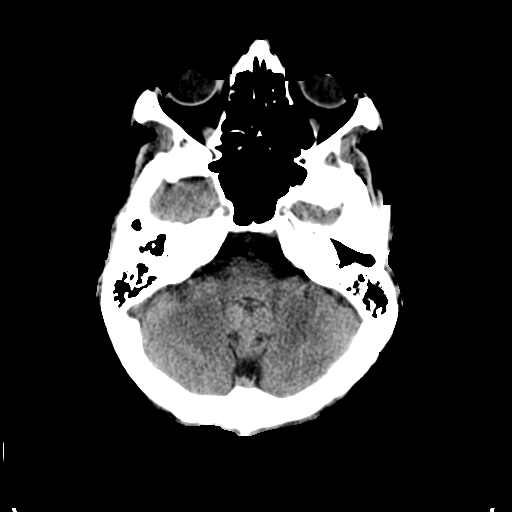
[im 9/36  brain]
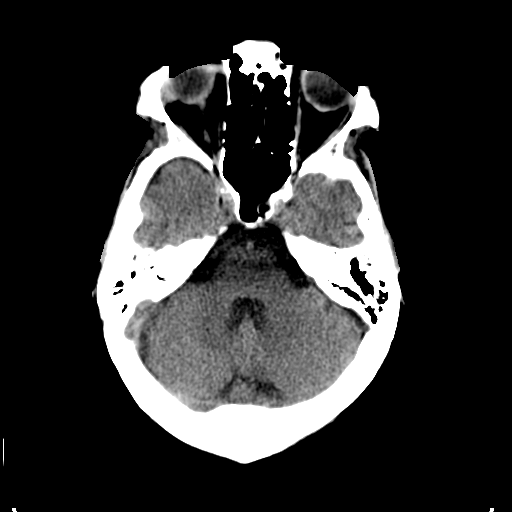
[im 10/36  brain]
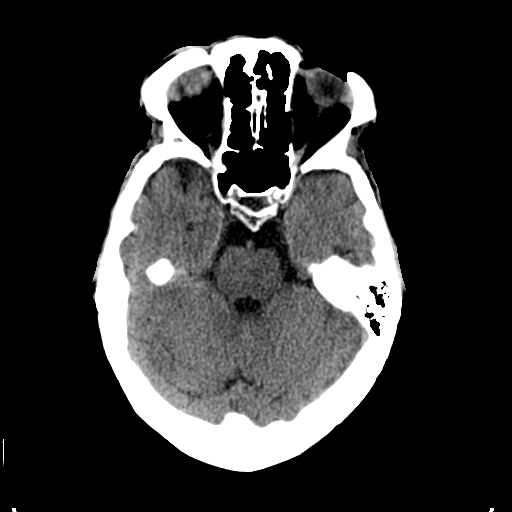
[im 10/36  bone]
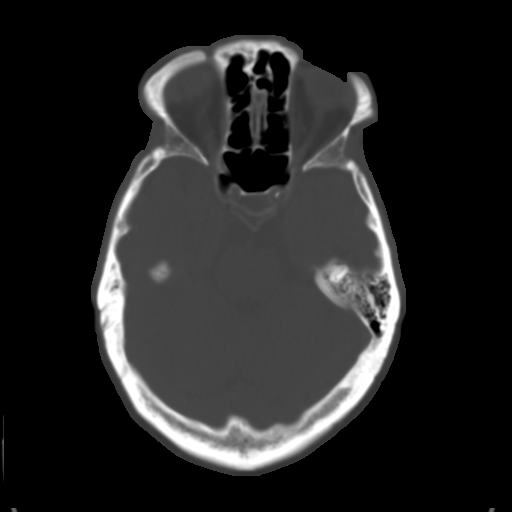
[im 13/36  brain]
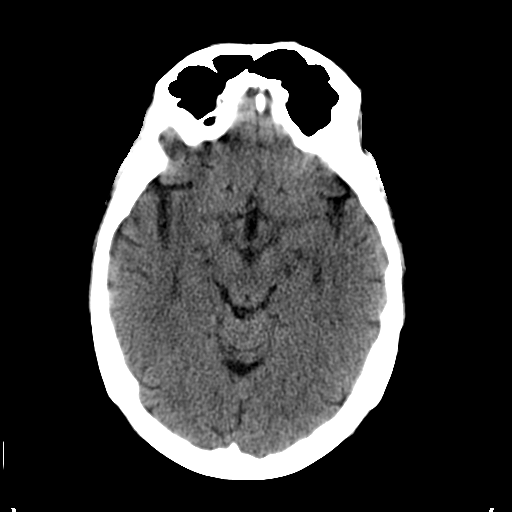
[im 15/36  brain]
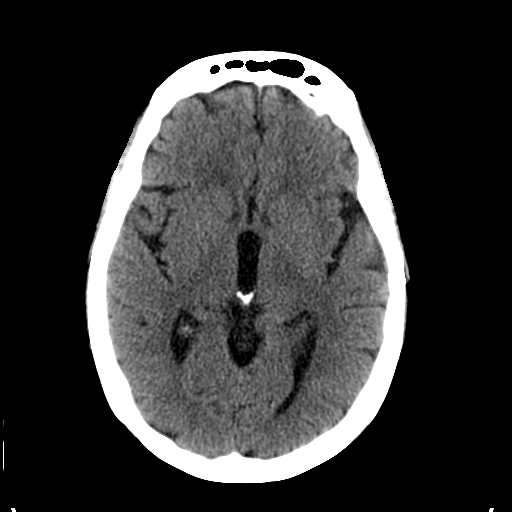
[im 17/36  brain]
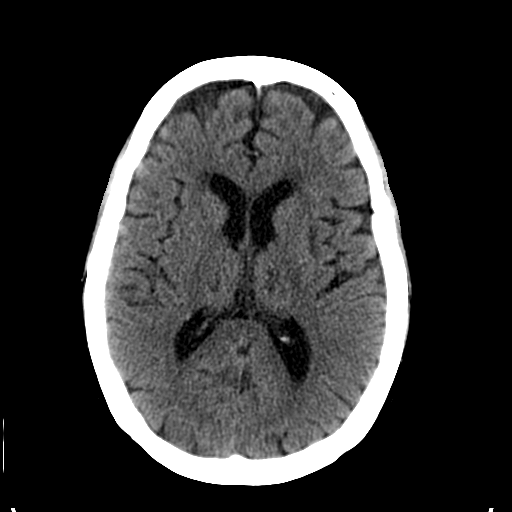
[im 19/36  brain]
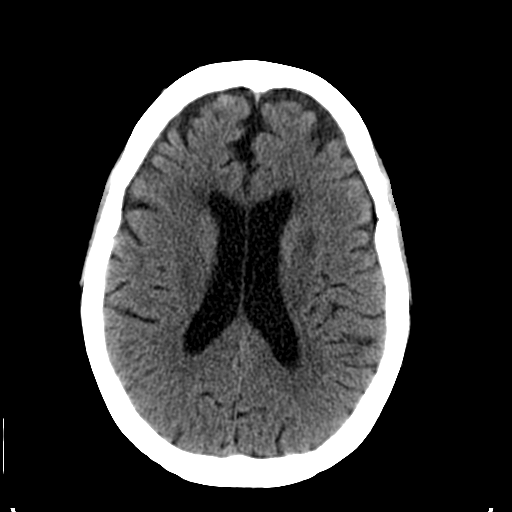
[im 19/36  bone]
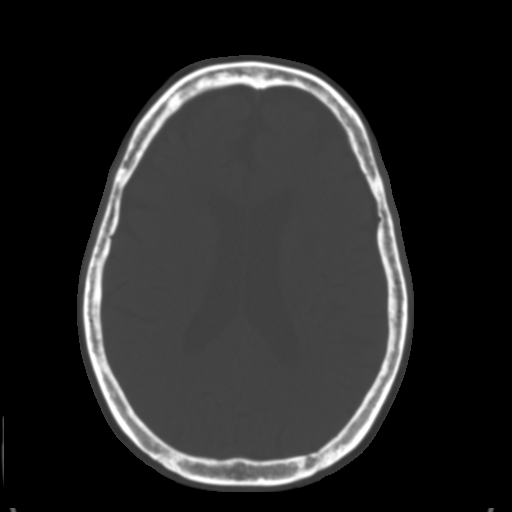
[im 21/36  brain]
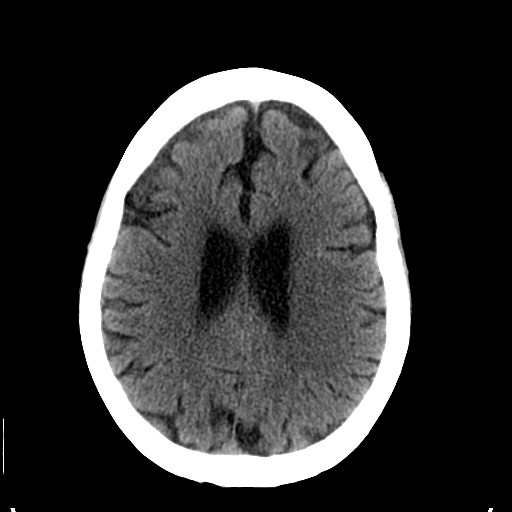
[im 23/36  brain]
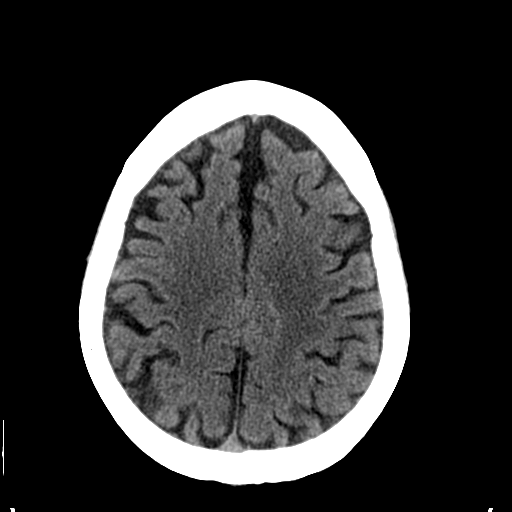
[im 26/36  brain]
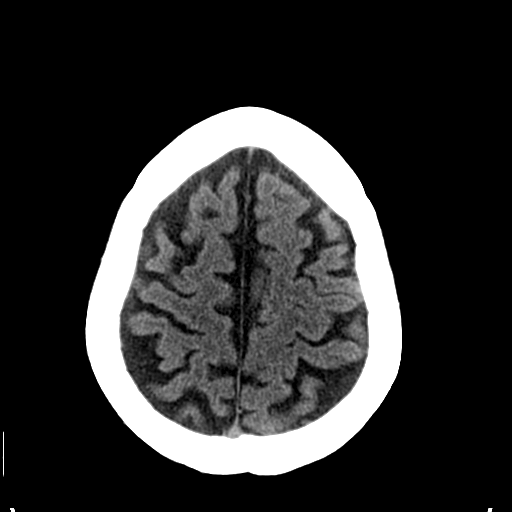
[im 27/36  brain]
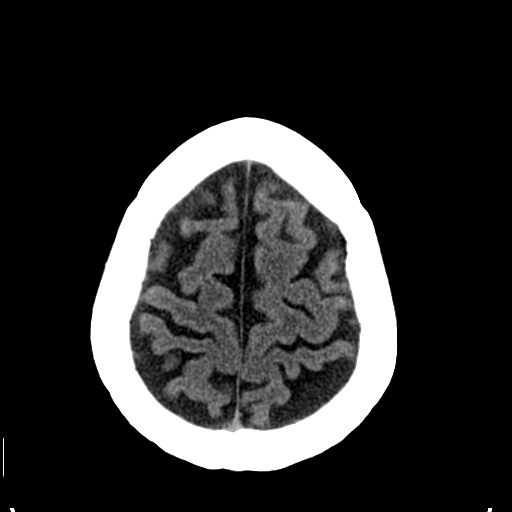
[im 27/36  bone]
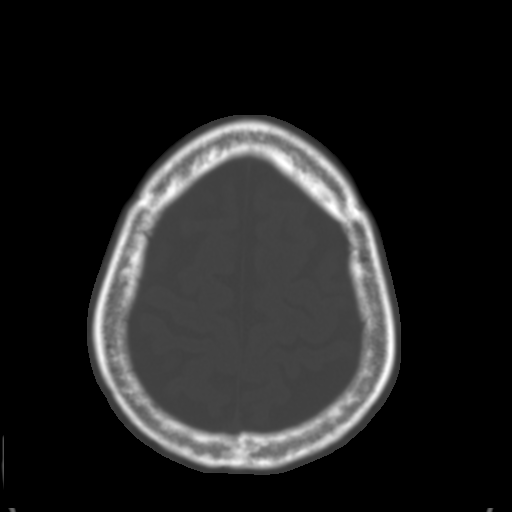
[im 29/36  brain]
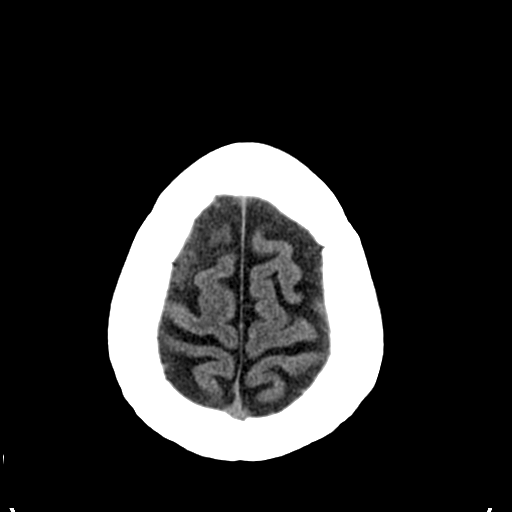
[im 32/36  brain]
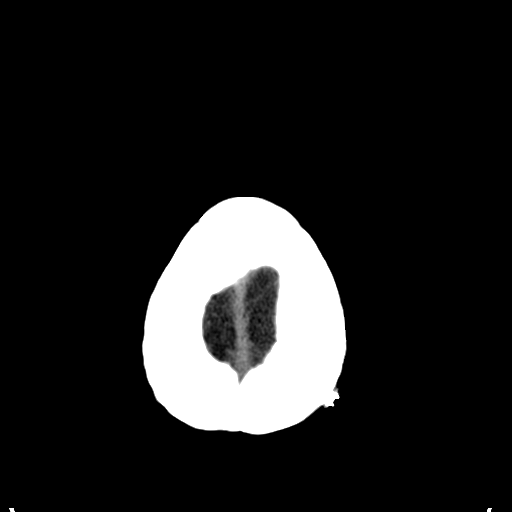
[im 34/36  brain]
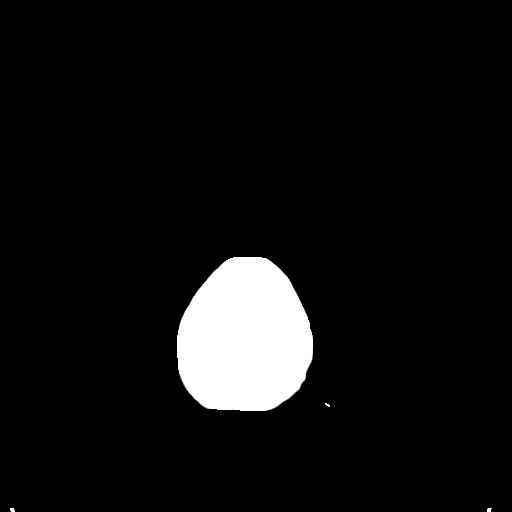

[16 of 30 positions shown; findings below may reference images not displayed]

FINDINGS: There is no evidence for acute infarction, intracranial
hemorrhage, mass lesion, hydrocephalus, or extra-axial fluid.
Mild premature cerebral atrophy.  Prominence of the superior
vermian cistern suggests cerebellar atrophy.  Chronic microvascular
ischemic change.  Calvarium intact.  No acute sinus or mastoid
disease.
IMPRESSION: Premature atrophy. Chronic microvascular ischemic change.  No
visible acute intracranial findings.

## 2012-01-10 NOTE — ED Provider Notes (Signed)
Medical screening examination/treatment/procedure(s) were performed by non-physician practitioner and as supervising physician I was immediately available for consultation/collaboration.  Raeford Razor, MD 01/10/12 [phone]

## 2012-02-02 ENCOUNTER — Encounter (HOSPITAL_COMMUNITY): Payer: Self-pay

## 2012-02-02 ENCOUNTER — Emergency Department (HOSPITAL_COMMUNITY)
Admission: EM | Admit: 2012-02-02 | Discharge: 2012-02-03 | Disposition: A | Discharge status: Home / Self Care | Payer: Self-pay | Attending: Emergency Medicine | Admitting: Emergency Medicine

## 2012-02-02 ENCOUNTER — Emergency Department (HOSPITAL_COMMUNITY)
Admission: EM | Admit: 2012-02-02 | Discharge: 2012-02-02 | Disposition: A | Discharge status: Home / Self Care | Payer: Self-pay | Attending: Emergency Medicine | Admitting: Emergency Medicine

## 2012-02-02 ENCOUNTER — Encounter (HOSPITAL_COMMUNITY): Payer: Self-pay | Admitting: Emergency Medicine

## 2012-02-02 DIAGNOSIS — Z5900 Homelessness unspecified: Secondary | ICD-10-CM | POA: Insufficient documentation

## 2012-02-02 DIAGNOSIS — R000 Tachycardia, unspecified: Secondary | ICD-10-CM

## 2012-02-02 DIAGNOSIS — F17200 Nicotine dependence, unspecified, uncomplicated: Secondary | ICD-10-CM | POA: Insufficient documentation

## 2012-02-02 DIAGNOSIS — Z86718 Personal history of other venous thrombosis and embolism: Secondary | ICD-10-CM | POA: Insufficient documentation

## 2012-02-02 DIAGNOSIS — F200 Paranoid schizophrenia: Secondary | ICD-10-CM | POA: Insufficient documentation

## 2012-02-02 DIAGNOSIS — R1110 Vomiting, unspecified: Secondary | ICD-10-CM | POA: Insufficient documentation

## 2012-02-02 LAB — POCT I-STAT, CHEM 8
Creatinine, Ser: 1.3 mg/dL (ref 0.50–1.35)
HCT: 33 % — ABNORMAL LOW (ref 39.0–52.0)
Hemoglobin: 11.2 g/dL — ABNORMAL LOW (ref 13.0–17.0)
Potassium: 3.8 mEq/L (ref 3.5–5.1)
Sodium: 145 mEq/L (ref 135–145)
TCO2: 21 mmol/L (ref 0–100)

## 2012-02-02 LAB — URINALYSIS, ROUTINE W REFLEX MICROSCOPIC
Leukocytes, UA: NEGATIVE
Nitrite: NEGATIVE
Protein, ur: NEGATIVE mg/dL
Specific Gravity, Urine: 1.012 (ref 1.005–1.030)
Urobilinogen, UA: 0.2 mg/dL (ref 0.0–1.0)

## 2012-02-02 LAB — ETHANOL: Alcohol, Ethyl (B): <11 mg/dL (ref 0–11)

## 2012-02-02 LAB — URINE MICROSCOPIC-ADD ON

## 2012-02-02 MED ORDER — THIAMINE HCL 100 MG/ML IJ SOLN: Freq: Once | INTRAVENOUS | Status: DC

## 2012-02-02 MED ORDER — THIAMINE HCL 100 MG/ML IJ SOLN
INTRAMUSCULAR | Status: DC
  Filled 2012-02-02: qty 1000

## 2012-02-02 MED ORDER — ADULT MULTIVITAMIN W/MINERALS CH: 1.0000 | ORAL_TABLET | ORAL | Status: DC

## 2012-02-02 NOTE — ED Notes (Addendum)
Pt. Reports N/V starting this AM. States he went to [facility] this AM. Was discharged with normal exam. Pt states he called EMS "because I just wasn't feeling well". Alert and oriented expect to time. Denies difficulty with urination/defecation. Bowel sounds active.

## 2012-02-02 NOTE — ED Notes (Signed)
Pt recently got out of jail and has no where to stay. Pt called EMS to bring him here.  Pt told EMS to tell hospital that he has nausea and that is why he needs to be seen but admitted to EMS that he isn't really nauseous.

## 2012-02-02 NOTE — ED Provider Notes (Signed)
Medical screening examination/treatment/procedure(s) were performed by non-physician practitioner and as supervising physician I was immediately available for consultation/collaboration.   Glynn Octave, MD 02/02/12 [phone]

## 2012-02-02 NOTE — ED Provider Notes (Signed)
History     CSN: [id number]  Arrival date & time 02/02/12  2046   First MD Initiated Contact with Patient 02/02/12 2113      Chief Complaint  Patient presents with  . Emesis    (Consider location/radiation/quality/duration/timing/severity/associated sxs/prior treatment) HPI  56 y/o male iNAD c/o 7x episodes of nonbloody nonbilious vomiting since his d/c form [facility] this a.m. EMS reports that he is incontinent of both urine and stool. Patient does not have any memory of soiling himself. Patient denies any loss of consciousness. He denies any alcohol or drug use. Denies abdominal pain, fever and diarrhea  Past Medical History  Diagnosis Date  . Achilles tendon rupture 01/09/2011  . Radial fracture 11/09/2010  . Polysubstance abuse 04/11/2011  . Hip fracture 08/11/2010  . Urinary retention 04/11/2011  . DVT (deep venous thrombosis) 01/09/2011    right leg per pt  . Paranoid schizophrenia     Past Surgical History  Procedure Date  . Hip surgery     right hip    No family history on file.  History  Substance Use Topics  . Smoking status: Current Everyday Smoker -- 0.5 packs/day for 28 years    Types: Cigarettes  . Smokeless tobacco: Never Used  . Alcohol Use: Yes     3-4 weeks ago, but not sure      Review of Systems  Constitutional: Negative for fever.  Respiratory: Negative for shortness of breath.   Cardiovascular: Negative for chest pain and palpitations.  Gastrointestinal: Positive for nausea and vomiting. Negative for abdominal pain and diarrhea.  Genitourinary: Negative for dysuria and urgency.  Neurological: Negative for headaches.  All other systems reviewed and are negative.    Allergies  Review of patient's allergies indicates no known allergies.  Home Medications  No current outpatient prescriptions on file.  BP 157/83  Pulse 75  Temp 97.4 F (36.3 C) (Oral)  Resp 21  SpO2 98%  Physical Exam  Vitals reviewed. Constitutional: He is oriented  to person, place, and time. He appears well-developed and well-nourished. No distress.  HENT:  Head: Normocephalic and atraumatic.  Right Ear: External ear normal.  Left Ear: External ear normal.  Mouth/Throat: Oropharynx is clear and moist.       No tongue laceration or intraoral trauma. No signs of head trauma: Hemotympanums, Battle's sign raccoon sinus  Eyes: Conjunctivae and EOM are normal. Pupils are equal, round, and reactive to light.  Neck: Normal range of motion.       No midline tenderness or step offs  Cardiovascular: Normal rate, regular rhythm, normal heart sounds and intact distal pulses.   Pulmonary/Chest: Effort normal and breath sounds normal.  Abdominal: Soft. Bowel sounds are normal. He exhibits no distension and no mass. There is no tenderness. There is no rebound.  Musculoskeletal: Normal range of motion.  Neurological: He is alert and oriented to person, place, and time.       Cranial nerves III through XII intact patient ambulates with a coordinated gait  Skin: Skin is warm and dry.       No signs of trauma  Psychiatric: He has a normal mood and affect.    ED Course  Procedures (including critical care time)  Labs Reviewed  URINALYSIS, ROUTINE W REFLEX MICROSCOPIC - Abnormal; Notable for the following:    Hgb urine dipstick TRACE (*)     All other components within normal limits  POCT I-STAT, CHEM 8 - Abnormal; Notable for the following:  BUN 27 (*)     Glucose, Bld 114 (*)     Hemoglobin 11.2 (*)     HCT 33.0 (*)     All other components within normal limits  URINE MICROSCOPIC-ADD ON - Abnormal; Notable for the following:    Casts HYALINE CASTS (*)     All other components within normal limits  ETHANOL   No results found.   1. Vomiting       MDM  Pt passed PO challenge by eating sandwich. Serial abdominal exams benign. VSS. Discussed case with attending who agrees with plan and stability to d/c to home.  Pt verbalized understanding and agrees  with care plan. Outpatient follow-up and return precautions given.          Wynetta Emery, PA-C 02/03/12 [phone]

## 2012-02-02 NOTE — ED Notes (Signed)
Per EMS, picked pt up at bus stop outside of [facility]. Pt. Alerted and oriented to person, place, situation, but disoriented to time. Incontinent of urine and stool. Reports N/V starting this morning. EMS reports tenderness in lower abdomen. Pt. Denies use of drugs or alcohol. Pt. Homeless.

## 2012-02-02 NOTE — ED Provider Notes (Signed)
History     CSN: [id number]  Arrival date & time 02/02/12  1329   First MD Initiated Contact with Patient 02/02/12 1438      Chief Complaint  Patient presents with  . "Needs place to stay"     (Consider location/radiation/quality/duration/timing/severity/associated sxs/prior treatment) HPI Comments: The patient arrives with the complaint of "I don't have a place to live". He has no physical complaints. No vomiting, no pain, no recent illness.  The history is provided by the patient.    Past Medical History  Diagnosis Date  . Achilles tendon rupture 01/09/2011  . Radial fracture 11/09/2010  . Polysubstance abuse 04/11/2011  . Hip fracture 08/11/2010  . Urinary retention 04/11/2011  . DVT (deep venous thrombosis) 01/09/2011    right leg per pt  . Paranoid schizophrenia     Past Surgical History  Procedure Date  . Hip surgery     right hip    History reviewed. No pertinent family history.  History  Substance Use Topics  . Smoking status: Current Everyday Smoker -- 0.5 packs/day for 28 years    Types: Cigarettes  . Smokeless tobacco: Never Used  . Alcohol Use: Yes     3-4 weeks ago, but not sure      Review of Systems  All other systems reviewed and are negative.    Allergies  Review of patient's allergies indicates no known allergies.  Home Medications  No current outpatient prescriptions on file.  BP 151/79  Pulse 62  Temp 97.4 F (36.3 C) (Oral)  Resp 20  SpO2 100%  Physical Exam  Constitutional: He is oriented to person, place, and time. He appears well-developed and well-nourished. No distress.  Cardiovascular: Regular rhythm.  Tachycardia present.   No murmur heard. Pulmonary/Chest: Effort normal and breath sounds normal.  Abdominal: Soft. There is no tenderness.  Musculoskeletal: Normal range of motion.  Neurological: He is alert and oriented to person, place, and time.  Skin: Skin is warm.  Psychiatric: He has a normal mood and affect.    ED  Course  Procedures (including critical care time)  Labs Reviewed - No data to display No results found.   No diagnosis found. 1. Homeless    MDM  IV fluids given for tachycardia and he now has a normal heart rate. Discussed discharge home and patient understands discharge instructions.        Rodena Medin, PA-C 02/02/12 1646

## 2012-02-02 NOTE — ED Notes (Signed)
Pt transported by EMS, reports that pt was just released from [facility], has been sitting outside for a while.  Pt reports that he had called EMS because he needed a place to stay.

## 2012-02-02 NOTE — ED Notes (Signed)
Pt was given a meal and water to drink

## 2012-02-02 NOTE — ED Notes (Signed)
Pt states that he got out of jail this am and has been sitting on the street corner since. Pt states he has not had anything to eat or drink since he got out of jail.  Pt given sandwich, chips, and water.  Pt denies any c/o at this time.

## 2012-02-03 MED ORDER — ONDANSETRON HCL 4 MG PO TABS: 4.0000 mg | ORAL_TABLET | Freq: Four times a day (QID) | ORAL | Status: DC

## 2012-02-03 NOTE — ED Provider Notes (Signed)
Medical screening examination/treatment/procedure(s) were performed by non-physician practitioner and as supervising physician I was immediately available for consultation/collaboration.  Ethelda Chick, MD 02/03/12 [phone]

## 2012-02-05 ENCOUNTER — Encounter (HOSPITAL_COMMUNITY): Payer: Self-pay | Admitting: Emergency Medicine

## 2012-02-05 ENCOUNTER — Emergency Department (HOSPITAL_COMMUNITY)
Admission: EM | Admit: 2012-02-05 | Discharge: 2012-02-05 | Disposition: A | Discharge status: Home / Self Care | Payer: Self-pay | Attending: Emergency Medicine | Admitting: Emergency Medicine

## 2012-02-05 DIAGNOSIS — Z86718 Personal history of other venous thrombosis and embolism: Secondary | ICD-10-CM | POA: Insufficient documentation

## 2012-02-05 DIAGNOSIS — Z765 Malingerer [conscious simulation]: Secondary | ICD-10-CM | POA: Insufficient documentation

## 2012-02-05 DIAGNOSIS — Z8659 Personal history of other mental and behavioral disorders: Secondary | ICD-10-CM | POA: Insufficient documentation

## 2012-02-05 DIAGNOSIS — R1110 Vomiting, unspecified: Secondary | ICD-10-CM | POA: Insufficient documentation

## 2012-02-05 NOTE — ED Notes (Signed)
[place] EMS from [place] with c/o nausea and vomiting.  Pt denies nausea upon arrival to ED room, no vomiting noted.

## 2012-02-05 NOTE — ED Notes (Signed)
RUE:AV40<JW> Expected date:<BR> Expected time:<BR> Means of arrival:<BR> Comments:<BR> EMS/nausea/vomiting/hypertensive and tachycardic

## 2012-02-05 NOTE — ED Provider Notes (Signed)
History     CSN: [id number]  Arrival date & time 02/05/12  2049   First MD Initiated Contact with Patient 02/05/12 2225      Chief Complaint  Patient presents with  . Emesis    (Consider location/radiation/quality/duration/timing/severity/associated sxs/prior treatment) HPI Comments: Patient states he called the ambulance from downtown because he was hungry and he needed something to eat.  He denies nausea, vomiting, diarrhea, fevers, dysuria, cough.  He, states he lives with his parents who take care of him, but he is unsure why.   Patient is a 56 y.o. male presenting with vomiting. The history is provided by the patient.  Emesis  Pertinent negatives include no abdominal pain, no chills, no cough, no diarrhea, no fever and no headaches.    Past Medical History  Diagnosis Date  . Achilles tendon rupture 01/09/2011  . Radial fracture 11/09/2010  . Polysubstance abuse 04/11/2011  . Hip fracture 08/11/2010  . Urinary retention 04/11/2011  . DVT (deep venous thrombosis) 01/09/2011    right leg per pt  . Paranoid schizophrenia     Past Surgical History  Procedure Date  . Hip surgery     right hip    History reviewed. No pertinent family history.  History  Substance Use Topics  . Smoking status: Current Everyday Smoker -- 0.5 packs/day for 28 years    Types: Cigarettes  . Smokeless tobacco: Never Used  . Alcohol Use: Yes     3-4 weeks ago, but not sure      Review of Systems  Constitutional: Negative for fever and chills.  HENT: Negative for ear pain and neck pain.   Respiratory: Negative for cough and shortness of breath.   Cardiovascular: Negative for chest pain.  Gastrointestinal: Negative for nausea, vomiting, abdominal pain, diarrhea and constipation.  Skin: Negative for wound.  Neurological: Negative for dizziness and headaches.    Allergies  Review of patient's allergies indicates no known allergies.  Home Medications  No current outpatient prescriptions on  file.  BP 173/91  Pulse 61  Temp 98.9 F (37.2 C) (Oral)  Resp 15  SpO2 100%  Physical Exam  Constitutional: He appears well-developed.  HENT:       Very poor dentation  Eyes: Pupils are equal, round, and reactive to light.  Neck: Normal range of motion.  Cardiovascular: Normal rate.   Pulmonary/Chest: Effort normal and breath sounds normal.  Abdominal: Soft. He exhibits no distension. There is no tenderness.  Musculoskeletal: Normal range of motion.  Neurological: He is alert.  Skin: Skin is warm.    ED Course  Procedures (including critical care time)  Labs Reviewed - No data to display No results found.   1. Maling[place]       MDM  Patient has no complaints just wants a meal  He has been observed in the ED for 1.5 hours without any episodes of vomiting           Arman Filter, NP 02/05/12 2243  Arman Filter, NP 02/05/12 2244

## 2012-02-06 ENCOUNTER — Emergency Department (HOSPITAL_COMMUNITY)
Admission: EM | Admit: 2012-02-06 | Discharge: 2012-02-06 | Disposition: A | Discharge status: Home / Self Care | Payer: Self-pay | Attending: Emergency Medicine | Admitting: Emergency Medicine

## 2012-02-06 ENCOUNTER — Encounter (HOSPITAL_COMMUNITY): Payer: Self-pay | Admitting: Emergency Medicine

## 2012-02-06 ENCOUNTER — Emergency Department (HOSPITAL_COMMUNITY)
Admission: EM | Admit: 2012-02-06 | Discharge: 2012-02-07 | Disposition: A | Discharge status: Home / Self Care | Payer: Self-pay | Attending: Emergency Medicine | Admitting: Emergency Medicine

## 2012-02-06 ENCOUNTER — Encounter (HOSPITAL_COMMUNITY): Payer: Self-pay | Admitting: *Deleted

## 2012-02-06 ENCOUNTER — Emergency Department (HOSPITAL_COMMUNITY): Payer: Self-pay

## 2012-02-06 DIAGNOSIS — R110 Nausea: Secondary | ICD-10-CM | POA: Insufficient documentation

## 2012-02-06 DIAGNOSIS — R109 Unspecified abdominal pain: Secondary | ICD-10-CM | POA: Insufficient documentation

## 2012-02-06 DIAGNOSIS — F17200 Nicotine dependence, unspecified, uncomplicated: Secondary | ICD-10-CM | POA: Insufficient documentation

## 2012-02-06 DIAGNOSIS — Z86718 Personal history of other venous thrombosis and embolism: Secondary | ICD-10-CM | POA: Insufficient documentation

## 2012-02-06 DIAGNOSIS — M8448XA Pathological fracture, other site, initial encounter for fracture: Secondary | ICD-10-CM | POA: Insufficient documentation

## 2012-02-06 DIAGNOSIS — F200 Paranoid schizophrenia: Secondary | ICD-10-CM | POA: Insufficient documentation

## 2012-02-06 NOTE — ED Notes (Signed)
Pt called x4 w no answer. Pt moved to OTF. RN aware of same

## 2012-02-06 NOTE — ED Notes (Signed)
Wait time advised 

## 2012-02-06 NOTE — ED Provider Notes (Signed)
Medical screening examination/treatment/procedure(s) were performed by non-physician practitioner and as supervising physician I was immediately available for consultation/collaboration.   Mairlyn Tegtmeyer L Jozeph Persing, MD 02/06/12 0603 

## 2012-02-06 NOTE — ED Notes (Signed)
Pt called EMS because "I didn't have anything else better to do tonight".  When asked about abd pain states "yea".  I informed patient of abusing the EMS system and he stated he has been charged before with abusing EMS system

## 2012-02-06 NOTE — ED Provider Notes (Signed)
Medical screening examination/treatment/procedure(s) were performed by non-physician practitioner and as supervising physician I was immediately available for consultation/collaboration.   Dayne Chait, MD 02/06/12 1455 

## 2012-02-06 NOTE — ED Notes (Signed)
Per EMS: pt c/o abd pain; pt seen at [facility] and sts was discharged and told if returned would be arrested.  Pt requested to come here; pt seen multiple times for same over last couple of days with multiple visits per day; pt sts some nausea today

## 2012-02-06 NOTE — ED Notes (Addendum)
Pt was seen here today for LLQ pain and has returned because of pain that remains. CBG 199.

## 2012-02-06 NOTE — ED Provider Notes (Signed)
History     CSN: [id number]  Arrival date & time 02/06/12  0007   First MD Initiated Contact with Patient 02/06/12 0154      Chief Complaint  Patient presents with  . Abdominal Pain    (Consider location/radiation/quality/duration/timing/severity/associated sxs/prior treatment) HPI Comments: Patient returns again complaining of abdominal pain, with no specific complaints, stating he is hungry.  He has not eaten.  He has not had any vomiting, diarrhea, nausea.  Patient is a 56 y.o. male presenting with abdominal pain. The history is provided by the patient.  Abdominal Pain The primary symptoms of the illness include abdominal pain. The primary symptoms of the illness do not include fever, shortness of breath, nausea, vomiting or diarrhea.  Symptoms associated with the illness do not include constipation or back pain.    Past Medical History  Diagnosis Date  . Achilles tendon rupture 01/09/2011  . Radial fracture 11/09/2010  . Polysubstance abuse 04/11/2011  . Hip fracture 08/11/2010  . Urinary retention 04/11/2011  . DVT (deep venous thrombosis) 01/09/2011    right leg per pt  . Paranoid schizophrenia     Past Surgical History  Procedure Date  . Hip surgery     right hip    No family history on file.  History  Substance Use Topics  . Smoking status: Current Everyday Smoker -- 0.5 packs/day for 28 years    Types: Cigarettes  . Smokeless tobacco: Never Used  . Alcohol Use: Yes     3-4 weeks ago, but not sure      Review of Systems  Constitutional: Negative for fever.  Respiratory: Negative for shortness of breath.   Gastrointestinal: Positive for abdominal pain. Negative for nausea, vomiting, diarrhea and constipation.  Musculoskeletal: Negative for back pain.  Neurological: Negative for dizziness and headaches.    Allergies  Review of patient's allergies indicates no known allergies.  Home Medications  No current outpatient prescriptions on file.  BP 135/74   Pulse 70  Temp 98 F (36.7 C) (Oral)  Resp 16  SpO2 97%  Physical Exam  Constitutional: He is oriented to person, place, and time. He appears well-developed. No distress.  HENT:  Head: Normocephalic.  Eyes: Pupils are equal, round, and reactive to light.  Neck: Normal range of motion.  Cardiovascular: Normal rate.   Pulmonary/Chest: Effort normal.  Abdominal: Soft. He exhibits no distension. There is no tenderness.  Musculoskeletal:       Inability in the [place] without limp or apparent discomfort  Neurological: He is alert and oriented to person, place, and time.  Skin: Skin is warm. He is not diaphoretic.    ED Course  Procedures (including critical care time)  Labs Reviewed - No data to display Dg Abd Acute W/chest  02/06/2012  *RADIOLOGY REPORT*  Clinical Data: Lower abdominal pain.  ACUTE ABDOMEN SERIES (ABDOMEN 2 VIEW & CHEST 1 VIEW)  Comparison: Chest 10/20/2011.  Abdomen 11/20/2010.  Findings: Normal heart size and pulmonary vascularity. Emphysematous changes and scattered fibrosis in the lungs. Scarring in the apices and left lower lung.  Stable appearance since previous study.  No focal consolidation.  Compression of the T11 vertebra, new since prior study.  Gas and stool throughout the colon.  Nondilated gas-filled mid abdominal small bowel.  No significant bowel distension to suggest obstruction.  No free intra-abdominal air.  No abnormal air fluid levels.  Degenerative changes in the lumbar spine and hips. Postoperative change in the right hip.  Vascular calcifications.  IMPRESSION:  Emphysematous changes, scarring, and fibrosis in the lungs, stable since previous study.  T11 compression fracture, new since previous study.  Nonobstructive bowel gas pattern.  Original Report Authenticated By: Marlon Pel, M.D.     No diagnosis found.    MDM  Acute abdomen series obtained to to patient's second visit "abdominal pain."  There is no obstruction.  There is a T11  compression fracture, which is new since his last x-ray, which was over a year ago.  Patient has no pain on exam in this area         Arman Filter, NP 02/06/12 0430

## 2012-02-06 NOTE — ED Notes (Signed)
Pt is difficult to assess, when asked why he is here, responded "I don't know", You called EMS to bring you here, "well that's why I'm here then". Stomach doesn't feel right, I have vomited 3-4 times this afternoon. Pt is too focused on TV to respond to questions

## 2012-02-06 NOTE — ED Notes (Signed)
Pt advised of the wait 

## 2012-02-06 NOTE — ED Notes (Signed)
Discharge instructions reviewed w/ pt., verbalizes understanidng. No prescriptions provided at discharge

## 2012-02-06 NOTE — ED Notes (Signed)
Pt sts "not feeling well and sort of throwing up"

## 2012-02-07 ENCOUNTER — Encounter (HOSPITAL_COMMUNITY): Payer: Self-pay | Admitting: Emergency Medicine

## 2012-02-07 ENCOUNTER — Emergency Department (HOSPITAL_COMMUNITY)
Admission: EM | Admit: 2012-02-07 | Discharge: 2012-02-08 | Discharge status: Against Medical Advice | Payer: Self-pay | Attending: Emergency Medicine | Admitting: Emergency Medicine

## 2012-02-07 ENCOUNTER — Emergency Department (HOSPITAL_COMMUNITY)
Admission: EM | Admit: 2012-02-07 | Discharge: 2012-02-07 | Disposition: A | Discharge status: Home / Self Care | Payer: Self-pay | Attending: Emergency Medicine | Admitting: Emergency Medicine

## 2012-02-07 ENCOUNTER — Encounter (HOSPITAL_COMMUNITY): Payer: Self-pay | Admitting: Family Medicine

## 2012-02-07 DIAGNOSIS — R109 Unspecified abdominal pain: Secondary | ICD-10-CM | POA: Insufficient documentation

## 2012-02-07 DIAGNOSIS — Z86718 Personal history of other venous thrombosis and embolism: Secondary | ICD-10-CM | POA: Insufficient documentation

## 2012-02-07 DIAGNOSIS — F17200 Nicotine dependence, unspecified, uncomplicated: Secondary | ICD-10-CM | POA: Insufficient documentation

## 2012-02-07 NOTE — ED Notes (Signed)
Pt states he is homeless now since his Father "no longer has anything to do with me".

## 2012-02-07 NOTE — ED Notes (Signed)
RUE:[id number]<JW> Expected date:02/07/12<BR> Expected time: 4:08 PM<BR> Means of arrival:Ambulance<BR> Comments:<BR> Sick to stomach

## 2012-02-07 NOTE — ED Notes (Signed)
Pt leaving AMA-reports "only came in because had no where else to go"  Denies any complaints and wants to leave.  [employer] officer Caudle witnessed to this conversation.  [employer] officer found this pt in wheelchair in parking lot "wheeling around"

## 2012-02-07 NOTE — ED Provider Notes (Signed)
History     CSN: [id number]  Arrival date & time 02/07/12  1615   First MD Initiated Contact with Patient 02/07/12 1648      Chief Complaint  Patient presents with  . Nausea  . Emesis    (Consider location/radiation/quality/duration/timing/severity/associated sxs/prior treatment) HPI Comments: Pt comes in with cc of abd pain. He has no hx of Gi surgeries, or any GIproblems. States that his pain has been off and on for the past few days. He has associated nausea, with emesis. Today, he has had 2 non bilious and non bloody emesis - last one being prior to arrival. Pt also reports that the pain is dull to sharp, and non radiating and located in the periumbilical region. He has no diarrhea. Pt denies any fevers, chills.  Pt has been seen in the ED for the same complain before, with no significant finding. On MD evaluation, pt reports being pain free, and nausea free - no meds dispensed by the ED.  Patient is a 56 y.o. male presenting with vomiting. The history is provided by the patient.  Emesis  Associated symptoms include abdominal pain. Pertinent negatives include no cough.    Past Medical History  Diagnosis Date  . Achilles tendon rupture 01/09/2011  . Radial fracture 11/09/2010  . Polysubstance abuse 04/11/2011  . Hip fracture 08/11/2010  . Urinary retention 04/11/2011  . DVT (deep venous thrombosis) 01/09/2011    right leg per pt  . Paranoid schizophrenia     Past Surgical History  Procedure Date  . Hip surgery     right hip    No family history on file.  History  Substance Use Topics  . Smoking status: Current Everyday Smoker -- 0.5 packs/day for 28 years    Types: Cigarettes  . Smokeless tobacco: Never Used  . Alcohol Use: Yes     3-4 weeks ago, but not sure      Review of Systems  Constitutional: Negative for activity change and appetite change.  Respiratory: Negative for cough and shortness of breath.   Cardiovascular: Negative for chest pain.    Gastrointestinal: Positive for nausea, vomiting and abdominal pain. Negative for constipation, blood in stool and abdominal distention.  Genitourinary: Negative for dysuria, urgency, frequency, hematuria and flank pain.    Allergies  Review of patient's allergies indicates no known allergies.  Home Medications  No current outpatient prescriptions on file.  BP 134/65  Pulse 109  Temp 98.2 F (36.8 C) (Oral)  Resp 18  SpO2 97%  Physical Exam  Constitutional: He is oriented to person, place, and time. He appears well-developed.  HENT:  Head: Normocephalic and atraumatic.  Eyes: Conjunctivae and EOM are normal. Pupils are equal, round, and reactive to light.  Neck: Normal range of motion. Neck supple.  Cardiovascular: Normal rate and regular rhythm.   Pulmonary/Chest: Effort normal and breath sounds normal.  Abdominal: Soft. Bowel sounds are normal. He exhibits no distension and no mass. There is no tenderness. There is no rebound and no guarding.  Neurological: He is alert and oriented to person, place, and time.  Skin: Skin is warm.    ED Course  Procedures (including critical care time)  Labs Reviewed - No data to display Dg Abd Acute W/chest  02/06/2012  *RADIOLOGY REPORT*  Clinical Data: Lower abdominal pain.  ACUTE ABDOMEN SERIES (ABDOMEN 2 VIEW & CHEST 1 VIEW)  Comparison: Chest 10/20/2011.  Abdomen 11/20/2010.  Findings: Normal heart size and pulmonary vascularity. Emphysematous changes and scattered  fibrosis in the lungs. Scarring in the apices and left lower lung.  Stable appearance since previous study.  No focal consolidation.  Compression of the T11 vertebra, new since prior study.  Gas and stool throughout the colon.  Nondilated gas-filled mid abdominal small bowel.  No significant bowel distension to suggest obstruction.  No free intra-abdominal air.  No abnormal air fluid levels.  Degenerative changes in the lumbar spine and hips. Postoperative change in the right hip.   Vascular calcifications.  IMPRESSION: Emphysematous changes, scarring, and fibrosis in the lungs, stable since previous study.  T11 compression fracture, new since previous study.  Nonobstructive bowel gas pattern.  Original Report Authenticated By: Marlon Pel, M.D.     1. Abdominal  pain, other specified site       MDM  Pt comes in for abd pain, n/v. Pt states that he is symptoms free at my evaluation. Exam shows no peritoenal findings and there is no evidence of dehydration, vitals are stable. - will discharge.        Derwood Kaplan, MD 02/08/12 [phone]

## 2012-02-07 NOTE — ED Notes (Signed)
Pt reports N/V since this am.

## 2012-02-07 NOTE — ED Notes (Signed)
Per EMS: This is pt's 3rd ED visit today for same symptoms. Reports abdominal pain for the past 12 hours. VSS. NAD

## 2012-02-07 NOTE — ED Notes (Signed)
Pt alert, arrives via EMS, c/o "throwing up all day", resp even unlabored, skin pwd

## 2012-02-08 ENCOUNTER — Emergency Department (HOSPITAL_COMMUNITY)
Admission: EM | Admit: 2012-02-08 | Discharge: 2012-02-08 | Disposition: A | Discharge status: Home / Self Care | Payer: Self-pay | Attending: Emergency Medicine | Admitting: Emergency Medicine

## 2012-02-08 ENCOUNTER — Encounter (HOSPITAL_COMMUNITY): Payer: Self-pay

## 2012-02-08 ENCOUNTER — Encounter (HOSPITAL_COMMUNITY): Payer: Self-pay | Admitting: Emergency Medicine

## 2012-02-08 DIAGNOSIS — R1110 Vomiting, unspecified: Secondary | ICD-10-CM | POA: Insufficient documentation

## 2012-02-08 DIAGNOSIS — F209 Schizophrenia, unspecified: Secondary | ICD-10-CM

## 2012-02-08 DIAGNOSIS — Z590 Homelessness: Secondary | ICD-10-CM

## 2012-02-08 DIAGNOSIS — F17200 Nicotine dependence, unspecified, uncomplicated: Secondary | ICD-10-CM | POA: Insufficient documentation

## 2012-02-08 DIAGNOSIS — R110 Nausea: Secondary | ICD-10-CM | POA: Insufficient documentation

## 2012-02-08 DIAGNOSIS — Z86718 Personal history of other venous thrombosis and embolism: Secondary | ICD-10-CM | POA: Insufficient documentation

## 2012-02-08 DIAGNOSIS — Z8659 Personal history of other mental and behavioral disorders: Secondary | ICD-10-CM | POA: Insufficient documentation

## 2012-02-08 DIAGNOSIS — Z0000 Encounter for general adult medical examination without abnormal findings: Secondary | ICD-10-CM | POA: Insufficient documentation

## 2012-02-08 NOTE — ED Provider Notes (Signed)
Medical screening examination/treatment/procedure(s) were performed by non-physician practitioner and as supervising physician I was immediately available for consultation/collaboration.   Lyanne Co, MD 02/08/12 [phone]

## 2012-02-08 NOTE — ED Notes (Signed)
Pt presents with no complaints except when asked why he needs to see MD, pt reports abdominal pain that began today.  Pt denies any nausea, last ate at [facility] this morning. Pt denies any dysuria or diarrhea.  Pt seen at [facility] this morning and discharged.

## 2012-02-08 NOTE — ED Notes (Signed)
Pt reports no complaints. Asked what brought him into the ED he said "nothing much".

## 2012-02-08 NOTE — ED Provider Notes (Signed)
History     CSN: [id number]  Arrival date & time 02/08/12  1252   First MD Initiated Contact with Patient 02/08/12 1328       Chief complaint:  watch TV and eat something.  HPI The patient has history of schizophrenia and is homeless. Patient has been seen many times recently in the emergency department for similar issues. The patient in fact had just been at [facility] long emergency room and was released at 8:30 AM. Patient has mentioned nausea to triage staff however when I asked him what is bothering him he states nothing. He denies nausea or abdominal pain. Patient states he would like to watch TV and had something to eat. Past Medical History  Diagnosis Date  . Achilles tendon rupture 01/09/2011  . Radial fracture 11/09/2010  . Polysubstance abuse 04/11/2011  . Hip fracture 08/11/2010  . Urinary retention 04/11/2011  . DVT (deep venous thrombosis) 01/09/2011    right leg per pt  . Paranoid schizophrenia     Past Surgical History  Procedure Date  . Hip surgery     right hip    No family history on file.  History  Substance Use Topics  . Smoking status: Current Everyday Smoker -- 0.5 packs/day for 28 years    Types: Cigarettes  . Smokeless tobacco: Never Used  . Alcohol Use: Yes     3-4 weeks ago, but not sure      Review of Systems  Constitutional: Negative for fever.  Respiratory: Negative for shortness of breath.   Cardiovascular: Negative for chest pain.  Gastrointestinal: Negative for abdominal pain.  All other systems reviewed and are negative.    Allergies  Review of patient's allergies indicates no known allergies.  Home Medications  No current outpatient prescriptions on file.  BP 119/73  Pulse 98  Temp 97.6 F (36.4 C) (Oral)  Resp 16  Ht 5\' 10"  (1.778 m)  Wt 126 lb (57.153 kg)  BMI 18.08 kg/m2  SpO2 97%  Physical Exam  Nursing note and vitals reviewed. Constitutional: No distress.       Disheveled  HENT:  Head: Normocephalic and atraumatic.    Right Ear: External ear normal.  Left Ear: External ear normal.  Eyes: Conjunctivae are normal. Right eye exhibits no discharge. Left eye exhibits no discharge. No scleral icterus.  Neck: Neck supple. No tracheal deviation present.  Cardiovascular: Normal rate, regular rhythm and intact distal pulses.   Pulmonary/Chest: Effort normal and breath sounds normal. No stridor. No respiratory distress. He has no wheezes. He has no rales.  Abdominal: Soft. Bowel sounds are normal. He exhibits no distension. There is no tenderness. There is no rebound and no guarding.  Musculoskeletal: He exhibits no edema and no tenderness.  Neurological: He is alert. He has normal strength. No sensory deficit. Cranial nerve deficit:  no gross defecits noted. He exhibits normal muscle tone. He displays no seizure activity. Coordination normal.  Skin: Skin is warm and dry. No rash noted.  Psychiatric: His affect is blunt. His affect is not labile. His speech is delayed. He is not agitated and not aggressive. He does not exhibit a depressed mood. He expresses no homicidal and no suicidal ideation.    ED Course  Procedures (including critical care time)  Labs Reviewed - No data to display No results found.   MDM  The patient has been seen numerous times for similar symptoms. In fact, he's been here almost daily for the last week or so. Patient  appears to have chronic mental health issues but at this time he does not appear to be overtly paranoid, suicidal or homicidal.  At this time there does not appear to be any evidence of an acute emergency medical condition and the patient appears stable for discharge with appropriate outpatient follow up.         Celene Kras, MD 02/08/12 [phone]

## 2012-02-08 NOTE — ED Notes (Signed)
To ED via [facility] medic 20 with c/o nausea-- was just d/c'd this am for same. Walked to [employer], was sitting outside and someone called 911.

## 2012-02-08 NOTE — ED Notes (Signed)
Pt alert, arrives from "[facility]", c/o "same thing as always", pt states "i keep throwing up", resp even unlabored, skin pwd, shows no s/s of distress or discomfort, ambulates to triage

## 2012-02-08 NOTE — ED Notes (Signed)
Pt sleeping soundly in room, no nausea, no emesis noted, cont to monitor, awaiting eval by ALP

## 2012-02-08 NOTE — ED Provider Notes (Signed)
History     CSN: [id number]  Arrival date & time 02/08/12  4098   First MD Initiated Contact with Patient 02/08/12 [phone]      Chief Complaint  Patient presents with  . Nausea    (Consider location/radiation/quality/duration/timing/severity/associated sxs/prior treatment) HPI [name] is a 56 y.o. male presents to the emergency department after being discharged from this facility at 4 AM.  He had walked to the [facility] and was sitting outside when someone called 911.  He stated to EMS that he had nausea.  When I asked him what had brought him to the emergency room, he stated the Ambulance brought him here because it was the closest place.  He denies any medical complaint.  When I asked what he needed from [place], he said "a discharge."  Past Medical History  Diagnosis Date  . Achilles tendon rupture 01/09/2011  . Radial fracture 11/09/2010  . Polysubstance abuse 04/11/2011  . Hip fracture 08/11/2010  . Urinary retention 04/11/2011  . DVT (deep venous thrombosis) 01/09/2011    right leg per pt  . Paranoid schizophrenia     Past Surgical History  Procedure Date  . Hip surgery     right hip    History reviewed. No pertinent family history.  History  Substance Use Topics  . Smoking status: Current Everyday Smoker -- 0.5 packs/day for 28 years    Types: Cigarettes  . Smokeless tobacco: Never Used  . Alcohol Use: Yes     3-4 weeks ago, but not sure      Review of Systems  Constitutional: Negative for fever, diaphoresis, appetite change, fatigue and unexpected weight change.  HENT: Negative for mouth sores.   Eyes: Negative for visual disturbance.  Respiratory: Negative for cough, chest tightness, shortness of breath and wheezing.   Cardiovascular: Negative for chest pain.  Gastrointestinal: Negative for nausea, vomiting, abdominal pain, diarrhea and constipation.  Genitourinary: Negative for dysuria, urgency, frequency and hematuria.  Musculoskeletal: Negative for back  pain.  Skin: Negative for rash.  Neurological: Negative for syncope, light-headedness and headaches.  Hematological: Does not bruise/bleed easily.  Psychiatric/Behavioral: Negative for disturbed wake/sleep cycle. The patient is not nervous/anxious.   All other systems reviewed and are negative.    Allergies  Review of patient's allergies indicates no known allergies.  Home Medications  No current outpatient prescriptions on file.  BP 135/69  Pulse 86  Temp 97.8 F (36.6 C) (Oral)  Resp 18  SpO2 100%  Physical Exam  Nursing note and vitals reviewed. Constitutional: He is oriented to person, place, and time. He appears well-developed and well-nourished. No distress.  HENT:  Head: Normocephalic and atraumatic.  Eyes: Conjunctivae are normal. No scleral icterus.  Neck: Normal range of motion. Neck supple.  Cardiovascular: Normal rate, regular rhythm, normal heart sounds and intact distal pulses.  Exam reveals no gallop and no friction rub.   No murmur heard. Pulmonary/Chest: Effort normal and breath sounds normal. No respiratory distress. He has no wheezes.  Abdominal: Soft. Bowel sounds are normal. He exhibits no mass. There is no tenderness. There is no rebound and no guarding.  Musculoskeletal: Normal range of motion.  Neurological: He is alert and oriented to person, place, and time.       Moves extremities without ataxia  Skin: Skin is warm and dry. He is not diaphoretic.  Psychiatric: He has a normal mood and affect.    ED Course  Procedures (including critical care time)  Labs Reviewed - No  data to display No results found.   No diagnosis found.    MDM  [name] was examined by Dr Judd Lien last night for the same complaint.  His medical record reveals a pattern of this behavior.  He is alert and oriented x3 and nontoxic appearing.  He denied the need to see a doctor previously tonight and this morning states he has no medical problem. His ROS is negative. He  is only here because "the ambulance brought me here."  I have examined him and I have found no concerning findings.  I will discharge without further workup.           Dahlia Client Dione Mccombie, PA-C 02/08/12 [phone]

## 2012-02-08 NOTE — ED Provider Notes (Signed)
History     CSN: [id number]  Arrival date & time 02/08/12  4010   First MD Initiated Contact with Patient 02/08/12 [phone]      Chief Complaint  Patient presents with  . Emesis    (Consider location/radiation/quality/duration/timing/severity/associated sxs/prior treatment) HPI Comments: [facility].  Has been seen here many times for similar complaints.  When I interview the patient, he "just wants something to eat and to watch tv".  He tells me he has no desire to see a physician.    The history is provided by the patient.    Past Medical History  Diagnosis Date  . Achilles tendon rupture 01/09/2011  . Radial fracture 11/09/2010  . Polysubstance abuse 04/11/2011  . Hip fracture 08/11/2010  . Urinary retention 04/11/2011  . DVT (deep venous thrombosis) 01/09/2011    right leg per pt  . Paranoid schizophrenia     Past Surgical History  Procedure Date  . Hip surgery     right hip    No family history on file.  History  Substance Use Topics  . Smoking status: Current Everyday Smoker -- 0.5 packs/day for 28 years    Types: Cigarettes  . Smokeless tobacco: Never Used  . Alcohol Use: Yes     3-4 weeks ago, but not sure      Review of Systems  All other systems reviewed and are negative.    Allergies  Review of patient's allergies indicates no known allergies.  Home Medications  No current outpatient prescriptions on file.  BP 132/73  Pulse 84  Temp 97.3 F (36.3 C) (Oral)  Resp 20  SpO2 100%  Physical Exam  Nursing note and vitals reviewed. Constitutional: He is oriented to person, place, and time. He appears well-developed and well-nourished. No distress.  HENT:  Head: Normocephalic and atraumatic.  Neck: Normal range of motion. Neck supple.  Cardiovascular: Normal rate and regular rhythm.   Pulmonary/Chest: Effort normal and breath sounds normal.  Abdominal: Soft. Bowel sounds are normal. He exhibits no  distension. There is no tenderness.  Musculoskeletal: Normal range of motion.  Neurological: He is alert and oriented to person, place, and time.  Skin: Skin is warm and dry. He is not diaphoretic.    ED Course  Procedures (including critical care time)  Labs Reviewed - No data to display No results found.   No diagnosis found.    MDM  The patient presents for what seems to me like wanting a place to stay and something to eat.  He has a pattern of this in his chart.  When I walked into the room and turned off the tv to speak with him, he told me he did not come here to see the doctor, just to watch tv.    He was examined and I see nothing on his exam that is concerning.  He will be discharged without further workup.        Geoffery Lyons, MD 02/08/12 [phone]

## 2012-02-10 ENCOUNTER — Encounter (HOSPITAL_COMMUNITY): Payer: Self-pay | Admitting: *Deleted

## 2012-02-10 ENCOUNTER — Emergency Department (HOSPITAL_COMMUNITY)
Admission: EM | Admit: 2012-02-10 | Discharge: 2012-02-11 | Disposition: A | Discharge status: Home / Self Care | Payer: Self-pay | Attending: Emergency Medicine | Admitting: Emergency Medicine

## 2012-02-10 DIAGNOSIS — R197 Diarrhea, unspecified: Secondary | ICD-10-CM

## 2012-02-10 DIAGNOSIS — F17200 Nicotine dependence, unspecified, uncomplicated: Secondary | ICD-10-CM | POA: Insufficient documentation

## 2012-02-10 DIAGNOSIS — E860 Dehydration: Secondary | ICD-10-CM | POA: Insufficient documentation

## 2012-02-10 DIAGNOSIS — R109 Unspecified abdominal pain: Secondary | ICD-10-CM | POA: Insufficient documentation

## 2012-02-10 DIAGNOSIS — Z5900 Homelessness unspecified: Secondary | ICD-10-CM | POA: Insufficient documentation

## 2012-02-10 DIAGNOSIS — D72829 Elevated white blood cell count, unspecified: Secondary | ICD-10-CM

## 2012-02-10 DIAGNOSIS — F209 Schizophrenia, unspecified: Secondary | ICD-10-CM | POA: Insufficient documentation

## 2012-02-10 DIAGNOSIS — N289 Disorder of kidney and ureter, unspecified: Secondary | ICD-10-CM

## 2012-02-10 LAB — COMPREHENSIVE METABOLIC PANEL
ALT: 17 U/L (ref 0–53)
AST: 20 U/L (ref 0–37)
CO2: 19 mEq/L (ref 19–32)
Calcium: 9.6 mg/dL (ref 8.4–10.5)
GFR calc non Af Amer: 51 mL/min — ABNORMAL LOW (ref >90)
Potassium: 3.8 mEq/L (ref 3.5–5.1)
Sodium: 144 mEq/L (ref 135–145)
Total Protein: 8.6 g/dL — ABNORMAL HIGH (ref 6.0–8.3)

## 2012-02-10 LAB — CBC WITH DIFFERENTIAL/PLATELET
Basophils Absolute: 0 10*3/uL (ref 0.0–0.1)
Eosinophils Relative: 0 % (ref 0–5)
Lymphocytes Relative: 19 % (ref 12–46)
MCV: 93.9 fL (ref 78.0–100.0)
Neutrophils Relative %: 74 % (ref 43–77)
Platelets: 721 10*3/uL — ABNORMAL HIGH (ref 150–400)
RDW: 15.3 % (ref 11.5–15.5)
WBC: 13.1 10*3/uL — ABNORMAL HIGH (ref 4.0–10.5)

## 2012-02-10 MED ORDER — LOPERAMIDE HCL 2 MG PO CAPS: 2.0000 mg | ORAL_CAPSULE | Freq: Four times a day (QID) | ORAL | Status: DC | PRN

## 2012-02-10 MED ORDER — SODIUM CHLORIDE 0.9 % IV BOLUS (SEPSIS)
1000.0000 mL | Freq: Once | INTRAVENOUS | Status: AC
  Administered 2012-02-10: 1000 mL via INTRAVENOUS

## 2012-02-10 NOTE — ED Provider Notes (Signed)
History     CSN: [id number]  Arrival date & time 02/10/12  1409   First MD Initiated Contact with Patient 02/10/12 2111      Chief Complaint  Patient presents with  . Abdominal Pain    (Consider location/radiation/quality/duration/timing/severity/associated sxs/prior treatment) Patient is a 56 y.o. male presenting with abdominal pain. The history is provided by the patient and medical records.  Abdominal Pain The primary symptoms of the illness include abdominal pain and diarrhea. The primary symptoms of the illness do not include fever, shortness of breath, nausea or vomiting.  Symptoms associated with the illness do not include chills or back pain.   he has a history of schizophrenia and is homeless.  He states that he had abdominal pain, but it is resolved.  Now.  The pain has been associated with diarrhea.  He has not had nausea and vomiting.  He has not been on antibiotics.  He has not had a fever, or rash.  Presently, he is asymptomatic.  He denies alcohol use.  Past Medical History  Diagnosis Date  . Achilles tendon rupture 01/09/2011  . Radial fracture 11/09/2010  . Polysubstance abuse 04/11/2011  . Hip fracture 08/11/2010  . Urinary retention 04/11/2011  . DVT (deep venous thrombosis) 01/09/2011    right leg per pt  . Paranoid schizophrenia     Past Surgical History  Procedure Date  . Hip surgery     right hip    History reviewed. No pertinent family history.  History  Substance Use Topics  . Smoking status: Current Everyday Smoker -- 0.5 packs/day for 28 years    Types: Cigarettes  . Smokeless tobacco: Never Used  . Alcohol Use: Yes     3-4 weeks ago, but not sure  8/10--states etoh months ago      Review of Systems  Constitutional: Negative for fever and chills.  HENT: Negative for neck pain.   Respiratory: Negative for cough and shortness of breath.   Cardiovascular: Negative for chest pain.  Gastrointestinal: Positive for abdominal pain and diarrhea.  Negative for nausea and vomiting.       Abdominal pain, is resolved  Musculoskeletal: Negative for back pain.  Skin: Negative for rash.  Neurological: Negative for headaches.  Psychiatric/Behavioral: Negative for confusion.  All other systems reviewed and are negative.    Allergies  Review of patient's allergies indicates no known allergies.  Home Medications  No current outpatient prescriptions on file.  BP 143/81  Pulse 72  Temp 97 F (36.1 C) (Oral)  Resp 16  SpO2 100%  Physical Exam  Nursing note and vitals reviewed. Constitutional: No distress.  HENT:  Head: Normocephalic and atraumatic.  Eyes: Conjunctivae are normal.  Neck: Normal range of motion. Neck supple.  Cardiovascular: Normal rate.   No murmur heard. Pulmonary/Chest: Effort normal. No respiratory distress.  Abdominal: Soft. He exhibits no distension. There is no tenderness. There is no rebound and no guarding.  Musculoskeletal: Normal range of motion. He exhibits no edema.  Neurological: He is alert. No cranial nerve deficit.  Skin: Skin is warm and dry.    ED Course  Procedures (including critical care time)  Labs Reviewed  CBC WITH DIFFERENTIAL - Abnormal; Notable for the following:    WBC 13.1 (*)     RBC 3.61 (*)     Hemoglobin 10.9 (*)     HCT 33.9 (*)     Platelets 721 (*)     Neutro Abs 9.6 (*)  All other components within normal limits  COMPREHENSIVE METABOLIC PANEL - Abnormal; Notable for the following:    Glucose, Bld 111 (*)     BUN 43 (*)     Creatinine, Ser 1.50 (*)     Total Protein 8.6 (*)     GFR calc non Af Amer 51 (*)     GFR calc Af Amer 60 (*)     All other components within normal limits   No results found.   No diagnosis found.    MDM  Abdominal pain, with diarrhea.  The abdominal pain, is resolved.  No acute abdomen.  Leukocytosis, is probably due 2, diarrhea.  There is no tenderness to his abdomen to indicate that he has an appendicitis.  Hepatobiliary  disease, or pancreatitis.  Mild dehydration, treated in the emergency department with IV fluids        Cheri Guppy, MD 02/10/12 [phone]

## 2012-02-10 NOTE — ED Notes (Signed)
Pt is here with abdominal pain that he has had for some time now.  Pt reports some vomiting.  Denies diarrhea.  Former ETOH use, but not in months.  Flat affect.  DENIES SI/HI.

## 2012-02-11 ENCOUNTER — Encounter (HOSPITAL_COMMUNITY): Payer: Self-pay | Admitting: Emergency Medicine

## 2012-02-11 ENCOUNTER — Emergency Department (HOSPITAL_COMMUNITY)
Admission: EM | Admit: 2012-02-11 | Discharge: 2012-02-11 | Disposition: A | Discharge status: Home / Self Care | Payer: Self-pay

## 2012-02-11 ENCOUNTER — Emergency Department (HOSPITAL_COMMUNITY)
Admission: EM | Admit: 2012-02-11 | Discharge: 2012-02-11 | Disposition: A | Discharge status: Home / Self Care | Payer: Self-pay | Attending: Emergency Medicine | Admitting: Emergency Medicine

## 2012-02-11 DIAGNOSIS — R110 Nausea: Secondary | ICD-10-CM | POA: Insufficient documentation

## 2012-02-11 DIAGNOSIS — Z86718 Personal history of other venous thrombosis and embolism: Secondary | ICD-10-CM | POA: Insufficient documentation

## 2012-02-11 DIAGNOSIS — F200 Paranoid schizophrenia: Secondary | ICD-10-CM | POA: Insufficient documentation

## 2012-02-11 DIAGNOSIS — F17200 Nicotine dependence, unspecified, uncomplicated: Secondary | ICD-10-CM | POA: Insufficient documentation

## 2012-02-11 HISTORY — DX: Chronic gastric ulcer without hemorrhage or perforation: K25.7

## 2012-02-11 LAB — POCT I-STAT, CHEM 8
BUN: 35 mg/dL — ABNORMAL HIGH (ref 6–23)
Calcium, Ion: 1.2 mmol/L (ref 1.12–1.23)
Chloride: 108 mEq/L (ref 96–112)
Glucose, Bld: 109 mg/dL — ABNORMAL HIGH (ref 70–99)

## 2012-02-11 NOTE — ED Provider Notes (Signed)
I saw and evaluated the patient, reviewed the resident's note and I agree with the findings and plan. Patient had stable hemoglobin from yesterday to today. Patient has frequent presentations for same complaint. His abdomen is soft and patient was able to tolerate by mouth prior to discharge.  Cyndra Numbers, MD 02/11/12 [phone]

## 2012-02-11 NOTE — ED Notes (Signed)
Patient states that he is having "some abd pain today and vomiting" also states that his abd pain in mid epigastric area but that "it doesn't hurt to bad".

## 2012-02-11 NOTE — ED Provider Notes (Signed)
History     CSN: [id number]  Arrival date & time 02/11/12  7829   First MD Initiated Contact with Patient 02/11/12 0818      Chief Complaint  Patient presents with  . Abdominal Pain    (Consider location/radiation/quality/duration/timing/severity/associated sxs/prior treatment) HPI Pt is a 56 yo male with multiple visits to the ED in the past week for nausea/vomiting who presents w chief complaint of the same. He has a history of paranoid schizophrenia, polysubstance abuse, and gastric ulcers. He complaints of stomach pain and nausea this morning. He says he thinks he vomited up a little bit of blood this morning. [facility]. He is currently not nauseous or in pain now. He says he wants to watch TV and eat peanut butter crackers.   He denies diarrhea, fever, chest pain, shortness of breath, dizziness.   Past Medical History  Diagnosis Date  . Achilles tendon rupture 01/09/2011  . Radial fracture 11/09/2010  . Polysubstance abuse 04/11/2011  . Hip fracture 08/11/2010  . Urinary retention 04/11/2011  . DVT (deep venous thrombosis) 01/09/2011    right leg per pt  . Paranoid schizophrenia   . Gastric ulcer, chronic     Past Surgical History  Procedure Date  . Hip surgery     right hip    No family history on file.  History  Substance Use Topics  . Smoking status: Current Everyday Smoker -- 0.5 packs/day for 28 years    Types: Cigarettes  . Smokeless tobacco: Never Used  . Alcohol Use: Yes     3-4 weeks ago, but not sure  8/10--states etoh months ago      Review of Systems  Constitutional: Negative for fever and chills.  Respiratory: Negative for shortness of breath.   Cardiovascular: Positive for chest pain. Negative for leg swelling.  Gastrointestinal: Positive for nausea, vomiting and abdominal pain.  Genitourinary: Negative for dysuria and hematuria.  Psychiatric/Behavioral: Negative for suicidal ideas.    Allergies  Review of  patient's allergies indicates no known allergies.  Home Medications  No current outpatient prescriptions on file.  BP 137/69  Pulse 72  Temp 97.5 F (36.4 C) (Oral)  Resp 20  SpO2 100%  Physical Exam  HENT:  Head: Normocephalic.       Prominent dental caries, poor dentition.  Cardiovascular: Normal rate, regular rhythm and normal heart sounds.  Exam reveals no gallop and no friction rub.   No murmur heard. Pulmonary/Chest: Effort normal and breath sounds normal. No respiratory distress.  Abdominal: Soft. Bowel sounds are normal. There is no tenderness. There is no rebound and no guarding.  Psychiatric:       Pt distracted and perseverative. Denies SI/HI.    ED Course  Procedures (including critical care time)  Labs Reviewed - No data to display No results found.   No diagnosis found.    MDM  1. Nausea Pt has been evaluated almost daily over the past week and worked-up for this problem with no organic etiology. Upon arrival to ED symptoms resolved and wants to watch TV and eat crackers. An iStat chem8 showed stable Hb/Hct from yesterday. No acute abdomen, no significant metabolic derangements. Remained symptom-free at time of discharge. Let him eat crackers. No further work-up.       Bronson Curb, MD 02/11/12 [phone]

## 2012-02-11 NOTE — ED Notes (Signed)
PT is checking in

## 2012-02-20 ENCOUNTER — Encounter (HOSPITAL_COMMUNITY): Payer: Self-pay | Admitting: *Deleted

## 2012-02-20 ENCOUNTER — Emergency Department (HOSPITAL_COMMUNITY)
Admission: EM | Admit: 2012-02-20 | Discharge: 2012-02-20 | Disposition: A | Discharge status: Home / Self Care | Payer: Self-pay | Attending: Emergency Medicine | Admitting: Emergency Medicine

## 2012-02-20 DIAGNOSIS — F200 Paranoid schizophrenia: Secondary | ICD-10-CM | POA: Insufficient documentation

## 2012-02-20 DIAGNOSIS — F1910 Other psychoactive substance abuse, uncomplicated: Secondary | ICD-10-CM | POA: Insufficient documentation

## 2012-02-20 DIAGNOSIS — R109 Unspecified abdominal pain: Secondary | ICD-10-CM | POA: Insufficient documentation

## 2012-02-20 DIAGNOSIS — Z86718 Personal history of other venous thrombosis and embolism: Secondary | ICD-10-CM | POA: Insufficient documentation

## 2012-02-20 DIAGNOSIS — R110 Nausea: Secondary | ICD-10-CM | POA: Insufficient documentation

## 2012-02-20 DIAGNOSIS — K257 Chronic gastric ulcer without hemorrhage or perforation: Secondary | ICD-10-CM | POA: Insufficient documentation

## 2012-02-20 DIAGNOSIS — F17200 Nicotine dependence, unspecified, uncomplicated: Secondary | ICD-10-CM | POA: Insufficient documentation

## 2012-02-20 MED ORDER — PROMETHAZINE HCL 25 MG PO TABS: 25.0000 mg | ORAL_TABLET | Freq: Four times a day (QID) | ORAL | Status: DC | PRN
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
## 2012-02-20 NOTE — ED Notes (Signed)
MD at bedside. 
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
## 2012-02-24 ENCOUNTER — Emergency Department (HOSPITAL_COMMUNITY)
Admission: EM | Admit: 2012-02-24 | Discharge: 2012-02-24 | Disposition: A | Discharge status: Home / Self Care | Payer: Self-pay | Attending: Emergency Medicine | Admitting: Emergency Medicine

## 2012-02-24 ENCOUNTER — Encounter (HOSPITAL_COMMUNITY): Payer: Self-pay | Admitting: *Deleted

## 2012-02-24 DIAGNOSIS — F17200 Nicotine dependence, unspecified, uncomplicated: Secondary | ICD-10-CM | POA: Insufficient documentation

## 2012-02-24 DIAGNOSIS — R109 Unspecified abdominal pain: Secondary | ICD-10-CM | POA: Insufficient documentation

## 2012-02-24 DIAGNOSIS — R110 Nausea: Secondary | ICD-10-CM | POA: Insufficient documentation

## 2012-02-24 DIAGNOSIS — Z86718 Personal history of other venous thrombosis and embolism: Secondary | ICD-10-CM | POA: Insufficient documentation

## 2012-02-24 LAB — CBC WITH DIFFERENTIAL/PLATELET
Basophils Absolute: 0 10*3/uL (ref 0.0–0.1)
Basophils Relative: 0 % (ref 0–1)
Eosinophils Relative: 2 % (ref 0–5)
HCT: 19.9 % — ABNORMAL LOW (ref 39.0–52.0)
MCHC: 32.2 g/dL (ref 30.0–36.0)
MCV: 93.9 fL (ref 78.0–100.0)
Monocytes Absolute: 0.6 10*3/uL (ref 0.1–1.0)
RDW: 14.2 % (ref 11.5–15.5)

## 2012-02-24 LAB — COMPREHENSIVE METABOLIC PANEL
AST: 24 U/L (ref 0–37)
Albumin: 3.4 g/dL — ABNORMAL LOW (ref 3.5–5.2)
Calcium: 9 mg/dL (ref 8.4–10.5)
Creatinine, Ser: 1.2 mg/dL (ref 0.50–1.35)
GFR calc non Af Amer: 67 mL/min — ABNORMAL LOW (ref >90)

## 2012-02-24 LAB — RETICULOCYTES: Retic Ct Pct: 1.3 % (ref 0.4–3.1)

## 2012-02-24 LAB — CBC
MCH: 31.5 pg (ref 26.0–34.0)
MCHC: 33.9 g/dL (ref 30.0–36.0)
MCV: 92.8 fL (ref 78.0–100.0)
Platelets: 720 10*3/uL — ABNORMAL HIGH (ref 150–400)
RDW: 14 % (ref 11.5–15.5)
WBC: 9.8 10*3/uL (ref 4.0–10.5)

## 2012-02-24 LAB — APTT: aPTT: 43 seconds — ABNORMAL HIGH (ref 24–37)

## 2012-02-24 LAB — FERRITIN: Ferritin: 1029 ng/mL — ABNORMAL HIGH (ref 22–322)

## 2012-02-24 LAB — TYPE AND SCREEN: Antibody Screen: NEGATIVE

## 2012-02-24 LAB — IRON AND TIBC: TIBC: 202 ug/dL — ABNORMAL LOW (ref 215–435)

## 2012-02-24 LAB — PROTIME-INR: INR: 1.17 (ref 0.00–1.49)

## 2012-02-24 MED ORDER — PROMETHAZINE HCL 25 MG PO TABS: 25.0000 mg | ORAL_TABLET | Freq: Four times a day (QID) | ORAL | Status: AC | PRN

## 2012-02-24 MED ORDER — SODIUM CHLORIDE 0.9 % IV SOLN
INTRAVENOUS | Status: DC
  Administered 2012-02-24: 17:00:00 via INTRAVENOUS

## 2012-02-24 MED ORDER — GI COCKTAIL ~~LOC~~
30.0000 mL | Freq: Once | ORAL | Status: AC
  Administered 2012-02-24: 30 mL via ORAL
  Filled 2012-02-24: qty 30

## 2012-02-24 MED ORDER — ONDANSETRON 8 MG PO TBDP
8.0000 mg | ORAL_TABLET | Freq: Once | ORAL | Status: AC
  Administered 2012-02-24: 8 mg via ORAL
  Filled 2012-02-24: qty 1

## 2012-02-24 NOTE — ED Notes (Signed)
Patient given discharge instructions, information, prescriptions, and diet order. Patient states that they adequately understand discharge information given and to return to ED if symptoms return or worsen.     

## 2012-02-24 NOTE — ED Provider Notes (Signed)
History     CSN: [id number]  Arrival date & time 02/24/12  1520   First MD Initiated Contact with Patient 02/24/12 1532      Chief Complaint  Patient presents with  . Abdominal Pain     HPI Pt states his stomach is not acting right.  He is having nausea but is not vomiting and is not having any pain.  He last ate lunch at [employer] a short time ago.  No vomiting or diarrhea.  No fever or cough. No dysuria.  Pt has been having issues similar to this over the last month or so.  He has ben seen multiple times for the same thing without any acute findings.  Denies recent alcohol.  Past Medical History  Diagnosis Date  . Achilles tendon rupture 01/09/2011  . Radial fracture 11/09/2010  . Polysubstance abuse 04/11/2011  . Hip fracture 08/11/2010  . Urinary retention 04/11/2011  . DVT (deep venous thrombosis) 01/09/2011    right leg per pt  . Paranoid schizophrenia   . Gastric ulcer, chronic     Past Surgical History  Procedure Date  . Hip surgery     right hip    History reviewed. No pertinent family history.  History  Substance Use Topics  . Smoking status: Current Everyday Smoker -- 0.5 packs/day for 28 years    Types: Cigarettes  . Smokeless tobacco: Never Used  . Alcohol Use: Yes     3-4 weeks ago, but not sure  8/10--states etoh months ago   [place]: homeless   Review of Systems  All other systems reviewed and are negative.    Allergies  Review of patient's allergies indicates no known allergies.  Home Medications   Current Outpatient Rx  Name Route Sig Dispense Refill  . ADULT MULTIVITAMIN W/MINERALS CH Oral Take 1 tablet by mouth daily.      BP 117/67  Pulse 95  Temp 97.4 F (36.3 C) (Oral)  Resp 18  SpO2 99%  Physical Exam  Nursing note and vitals reviewed. Constitutional: No distress.  HENT:  Head: Normocephalic and atraumatic.  Right Ear: External ear normal.  Left Ear: External ear normal.  Eyes: Conjunctivae are normal. Right eye exhibits no  discharge. Left eye exhibits no discharge. No scleral icterus.  Neck: Neck supple. No tracheal deviation present.  Cardiovascular: Normal rate, regular rhythm and intact distal pulses.   Pulmonary/Chest: Effort normal and breath sounds normal. No stridor. No respiratory distress. He has no wheezes. He has no rales.  Abdominal: Soft. Bowel sounds are normal. He exhibits no distension. There is no tenderness. There is no rebound and no guarding.  Musculoskeletal: He exhibits no edema and no tenderness.  Neurological: He is alert. He has normal strength. No sensory deficit. Cranial nerve deficit:  no gross defecits noted. He exhibits normal muscle tone. He displays no seizure activity. Coordination normal.  Skin: Skin is warm and dry. Rash noted. He is not diaphoretic. No erythema. No pallor.       Tinea pedis; sunburn on face  Psychiatric: He has a normal mood and affect.    ED Course  Procedures (including critical care time)  Labs Reviewed  CBC WITH DIFFERENTIAL - Abnormal; Notable for the following:    RBC 2.12 (*)     Hemoglobin 6.4 (*)     HCT 19.9 (*)     All other components within normal limits  COMPREHENSIVE METABOLIC PANEL - Abnormal; Notable for the following:    Sodium  134 (*)     Glucose, Bld 127 (*)     Albumin 3.4 (*)     GFR calc non Af Amer 67 (*)     GFR calc Af Amer 78 (*)     All other components within normal limits  APTT - Abnormal; Notable for the following:    aPTT 43 (*)     All other components within normal limits  RETICULOCYTES - Abnormal; Notable for the following:    RBC. 3.21 (*)     All other components within normal limits  CBC - Abnormal; Notable for the following:    RBC 3.21 (*)     Hemoglobin 10.1 (*)     HCT 29.8 (*)     Platelets 720 (*)     All other components within normal limits  TYPE AND SCREEN  PROTIME-INR  OCCULT BLOOD, POC DEVICE  VITAMIN B12  FOLATE  IRON AND TIBC  FERRITIN   No results found.    MDM  Initial lab  showed a very low hemoglobin.  Repeat CBC was normal.  Negative stool guiac.    No singificant abnormalities noted.  Pt has been to the ED multiple times for similar symptoms.  May be related to his alcohol/substance abuse.  Secondary gain may be playing a role as well.        Celene Kras, MD 02/24/12 [phone]

## 2012-02-24 NOTE — ED Notes (Signed)
Patient with c/o abdominal pain.  Patient was seen yesterday at another facility for same and states that his stomach still hurts.

## 2012-02-25 ENCOUNTER — Encounter (HOSPITAL_COMMUNITY): Payer: Self-pay | Admitting: *Deleted

## 2012-02-25 ENCOUNTER — Emergency Department (HOSPITAL_COMMUNITY)
Admission: EM | Admit: 2012-02-25 | Discharge: 2012-02-25 | Disposition: A | Discharge status: Home / Self Care | Payer: Self-pay | Attending: Emergency Medicine | Admitting: Emergency Medicine

## 2012-02-25 DIAGNOSIS — F17200 Nicotine dependence, unspecified, uncomplicated: Secondary | ICD-10-CM | POA: Insufficient documentation

## 2012-02-25 DIAGNOSIS — R110 Nausea: Secondary | ICD-10-CM | POA: Insufficient documentation

## 2012-02-25 DIAGNOSIS — Z765 Malingerer [conscious simulation]: Secondary | ICD-10-CM

## 2012-02-25 MED ORDER — ONDANSETRON 4 MG PO TBDP
4.0000 mg | ORAL_TABLET | Freq: Once | ORAL | Status: AC
  Administered 2012-02-25: 4 mg via ORAL
  Filled 2012-02-25: qty 1
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
## 2012-02-25 NOTE — ED Provider Notes (Signed)
History     CSN: [id number]  Arrival date & time 02/25/12  4098   First MD Initiated Contact with Patient 02/25/12 [phone]      Chief Complaint  Patient presents with  . Nausea    (Consider location/radiation/quality/duration/timing/severity/associated sxs/prior treatment) The history is provided by the patient and medical records.   he complains of nausea.  He denies vomiting.  He denies diarrhea.  He denies pain anywhere.  He has been seen for similar symptoms.  Multiple times in the past.  Past Medical History  Diagnosis Date  . Achilles tendon rupture 01/09/2011  . Radial fracture 11/09/2010  . Polysubstance abuse 04/11/2011  . Hip fracture 08/11/2010  . Urinary retention 04/11/2011  . DVT (deep venous thrombosis) 01/09/2011    right leg per pt  . Paranoid schizophrenia   . Gastric ulcer, chronic     Past Surgical History  Procedure Date  . Hip surgery     right hip    History reviewed. No pertinent family history.  History  Substance Use Topics  . Smoking status: Current Everyday Smoker -- 0.5 packs/day for 28 years    Types: Cigarettes  . Smokeless tobacco: Never Used  . Alcohol Use: Yes     3-4 weeks ago, but not sure  8/10--states etoh months ago      Review of Systems  Gastrointestinal: Positive for nausea.  All other systems reviewed and are negative.    Allergies  Review of patient's allergies indicates no known allergies.  Home Medications   Current Outpatient Rx  Name Route Sig Dispense Refill  . ADULT MULTIVITAMIN W/MINERALS CH Oral Take 1 tablet by mouth daily.    Marland Kitchen PROMETHAZINE HCL 25 MG PO TABS Oral Take 1 tablet (25 mg total) by mouth every 6 (six) hours as needed for nausea. 12 tablet 0    BP 147/63  Pulse 77  Temp 97.6 F (36.4 C) (Oral)  Resp 18  SpO2 100%  Physical Exam  Nursing note and vitals reviewed. Constitutional: He appears well-developed and well-nourished. No distress.  HENT:  Head: Normocephalic and atraumatic.  Eyes:  Conjunctivae and EOM are normal.  Neck: Neck supple.  Pulmonary/Chest: Effort normal.  Abdominal: He exhibits no distension.  Musculoskeletal: Normal range of motion.  Neurological: He is alert.  Skin: Skin is warm and dry.  Psychiatric: He has a normal mood and affect.    ED Course  Procedures (including critical care time).  No acute illness.  No tests indicated  Labs Reviewed - No data to display No results found.   No diagnosis found.    MDM  Nausea        Cheri Guppy, MD 02/25/12 0830

## 2012-02-25 NOTE — ED Notes (Signed)
PT called EMS from the [facility] w/ c/o of nausea. Pt just dc'd from ED.

## 2012-02-25 NOTE — ED Notes (Signed)
Report received-airway intact-no s/s's of distress-will continue to monitor 

## 2012-03-14 ENCOUNTER — Emergency Department (HOSPITAL_COMMUNITY)
Admission: EM | Admit: 2012-03-14 | Discharge: 2012-03-14 | Disposition: A | Discharge status: Home / Self Care | Payer: Self-pay | Attending: Emergency Medicine | Admitting: Emergency Medicine

## 2012-03-14 ENCOUNTER — Encounter (HOSPITAL_COMMUNITY): Payer: Self-pay | Admitting: *Deleted

## 2012-03-14 DIAGNOSIS — Z765 Malingerer [conscious simulation]: Secondary | ICD-10-CM

## 2012-03-14 DIAGNOSIS — K029 Dental caries, unspecified: Secondary | ICD-10-CM | POA: Insufficient documentation

## 2012-03-14 DIAGNOSIS — F17200 Nicotine dependence, unspecified, uncomplicated: Secondary | ICD-10-CM | POA: Insufficient documentation

## 2012-03-14 DIAGNOSIS — F200 Paranoid schizophrenia: Secondary | ICD-10-CM | POA: Insufficient documentation

## 2012-03-14 DIAGNOSIS — Z86718 Personal history of other venous thrombosis and embolism: Secondary | ICD-10-CM | POA: Insufficient documentation

## 2012-03-14 DIAGNOSIS — M79609 Pain in unspecified limb: Secondary | ICD-10-CM | POA: Insufficient documentation

## 2012-03-14 MED ORDER — ACETAMINOPHEN 325 MG PO TABS
650.0000 mg | ORAL_TABLET | Freq: Once | ORAL | Status: AC
  Administered 2012-03-14: 650 mg via ORAL
  Filled 2012-03-14: qty 2

## 2012-03-14 MED ORDER — ACETAMINOPHEN 500 MG PO TABS: 500.0000 mg | ORAL_TABLET | Freq: Four times a day (QID) | ORAL | Status: AC | PRN

## 2012-03-14 NOTE — ED Notes (Signed)
Per EMS: Pt reports right leg pain at 12:00 this afternoon. States was seen at [facility] earlier today for same. VSS. NAD. Pt ambulatory.

## 2012-03-14 NOTE — ED Notes (Signed)
Pt c/o right leg pain; states fell earlier today

## 2012-03-14 NOTE — ED Provider Notes (Signed)
Medical screening examination/treatment/procedure(s) were performed by non-physician practitioner and as supervising physician I was immediately available for consultation/collaboration.  Yannet Rincon, MD 03/14/12 2313 

## 2012-03-14 NOTE — ED Provider Notes (Signed)
History     CSN: [id number]  Arrival date & time 03/14/12  2031   First MD Initiated Contact with Patient 03/14/12 2201      Chief Complaint  Patient presents with  . Leg Pain    (Consider location/radiation/quality/duration/timing/severity/associated sxs/prior treatment) HPI  56 year old male with history of polysubstance abuse, paranoid schizophrenia, and multiple ER visits for various complaints but is in today for evaluations of right leg pain. Patient reports he has right leg pain after falling today. Sts he tripped and fell, landed on his R lower leg and injured it.  He thinks he may have broken his bone.  Sts he is able to walk without difficulty.  Denies injuring any other body parts.  Denies recent alcohol use.  He also reports he was seen at [facility] today for the same complaint and was given "iron pills".    Past Medical History  Diagnosis Date  . Achilles tendon rupture 01/09/2011  . Radial fracture 11/09/2010  . Polysubstance abuse 04/11/2011  . Hip fracture 08/11/2010  . Urinary retention 04/11/2011  . DVT (deep venous thrombosis) 01/09/2011    right leg per pt  . Paranoid schizophrenia   . Gastric ulcer, chronic     Past Surgical History  Procedure Date  . Hip surgery     right hip    No family history on file.  History  Substance Use Topics  . Smoking status: Current Every Day Smoker -- 0.5 packs/day for 28 years    Types: Cigarettes  . Smokeless tobacco: Never Used  . Alcohol Use: Yes     3-4 weeks ago, but not sure  8/10--states etoh months ago      Review of Systems  Constitutional: Negative for fever.  HENT: Negative for neck pain.   Cardiovascular: Negative for chest pain.  Gastrointestinal: Negative for abdominal pain.  Musculoskeletal: Negative for back pain and joint swelling.  Skin: Negative for wound.  Neurological: Negative for headaches.  All other systems reviewed and are negative.    Allergies  Review of patient's allergies  indicates no known allergies.  Home Medications   Current Outpatient Rx  Name Route Sig Dispense Refill  . ADULT MULTIVITAMIN W/MINERALS CH Oral Take 1 tablet by mouth daily.      BP 182/92  Pulse 85  Temp 97.8 F (36.6 C)  Resp 20  SpO2 97%  Physical Exam  Nursing note and vitals reviewed. Constitutional: He appears well-developed and well-nourished. No distress.  HENT:       Significant dental decay  Eyes: Conjunctivae normal are normal.  Neck: Neck supple.  Abdominal: There is no tenderness.  Musculoskeletal: Normal range of motion.       Right hip: Normal.       Right knee: Normal.       Right ankle: Normal.       Right lower leg: He exhibits no tenderness, no bony tenderness, no swelling, no edema and no deformity.  Neurological: He is alert.  Skin: Skin is warm. No erythema.  Psychiatric: He has a normal mood and affect.    ED Course  Procedures (including critical care time)  Labs Reviewed - No data to display No results found.   No diagnosis found.  1. Right leg pain  MDM  Pt points to R anterior tibial region and sts he thinks the injury is there.  On exam, no overlying skin changes, no deformity, nontender on palpation.  Pt able to ambulate without difficulty.  Normal hip, knee and ankle exam.  Will give tylenol.          Fayrene Helper, PA-C 03/14/12 2242

## 2012-03-15 ENCOUNTER — Encounter (HOSPITAL_COMMUNITY): Payer: Self-pay

## 2012-03-15 ENCOUNTER — Emergency Department (HOSPITAL_COMMUNITY)
Admission: EM | Admit: 2012-03-15 | Discharge: 2012-03-15 | Disposition: A | Discharge status: Home / Self Care | Payer: Self-pay | Attending: Emergency Medicine | Admitting: Emergency Medicine

## 2012-03-15 DIAGNOSIS — M79671 Pain in right foot: Secondary | ICD-10-CM

## 2012-03-15 DIAGNOSIS — F17200 Nicotine dependence, unspecified, uncomplicated: Secondary | ICD-10-CM | POA: Insufficient documentation

## 2012-03-15 DIAGNOSIS — M79609 Pain in unspecified limb: Secondary | ICD-10-CM | POA: Insufficient documentation

## 2012-03-15 MED ORDER — IBUPROFEN 800 MG PO TABS
800.0000 mg | ORAL_TABLET | Freq: Once | ORAL | Status: AC
  Administered 2012-03-15: 800 mg via ORAL
  Filled 2012-03-15: qty 1

## 2012-03-15 NOTE — ED Provider Notes (Signed)
History     CSN: [id number]  Arrival date & time 03/15/12  0050   First MD Initiated Contact with Patient 03/15/12 0107      Chief Complaint  Patient presents with  . Leg Pain    right lower leg    (Consider location/radiation/quality/duration/timing/severity/associated sxs/prior treatment) HPI 56 year old male presents to emergency room via EMS with complaint of right foot pain. Patient was seen earlier in the ED for right leg pain and was discharged after Tylenol. Patient is vague about when and how his right foot pain started. Patient reports sometimes after lunch he had pain. He's been able to walk without difficulties. Patient feels he has a broken bones somewhere in his foot. He reports the entire foot hurts up to the toes. Patient reports the doctor at "that other hospital" only given Tylenol and let him go home, the patient is unclear as to what he wanted to actually happened during his previous ED visit. No prior history of gout, no sores or infections reported. Patient with history of DVT and Achilles tendon rupture in the past. Past Medical History  Diagnosis Date  . Achilles tendon rupture 01/09/2011  . Radial fracture 11/09/2010  . Polysubstance abuse 04/11/2011  . Hip fracture 08/11/2010  . Urinary retention 04/11/2011  . DVT (deep venous thrombosis) 01/09/2011    right leg per pt  . Paranoid schizophrenia   . Gastric ulcer, chronic     Past Surgical History  Procedure Date  . Hip surgery     right hip    No family history on file.  History  Substance Use Topics  . Smoking status: Current Every Day Smoker -- 0.5 packs/day for 28 years    Types: Cigarettes  . Smokeless tobacco: Never Used  . Alcohol Use: Yes     3-4 weeks ago, but not sure  8/10--states etoh months ago      Review of Systems  Unable to perform ROS: Psychiatric disorder    Allergies  Review of patient's allergies indicates no known allergies.  Home Medications   Current Outpatient Rx    Name Route Sig Dispense Refill  . ADULT MULTIVITAMIN W/MINERALS CH Oral Take 1 tablet by mouth daily.    . ACETAMINOPHEN 500 MG PO TABS Oral Take 1 tablet (500 mg total) by mouth every 6 (six) hours as needed for pain. 15 tablet 0    BP 135/82  Temp 96.9 F (36.1 C) (Oral)  Resp 16  Ht 5\' 10"  (1.778 m)  Wt 126 lb (57.153 kg)  BMI 18.08 kg/m2  Physical Exam  Nursing note and vitals reviewed. Constitutional:       Unkempt male no acute distress.  HENT:  Head: Normocephalic and atraumatic.  Musculoskeletal: Normal range of motion. He exhibits no edema and no tenderness.       Patient noted to have blood at base of great toe nails bilaterally consistent with an elevation injury/nail bed disruption. No active bleeding or signs of infection. They all of patient's toenails are extremely long and thick. No bruising or injury noted to right foot. Normal range of motion of the foot. No crepitus, no deformity. No edema. No pain with palpation over the foot.  Skin: Skin is warm and dry. No rash noted. No erythema. No pallor.  Psychiatric:       Flat affect, paranoid appearing    ED Course  Procedures (including critical care time)  Labs Reviewed - No data to display No results found.  1. Right foot pain       MDM  56 year old male reports right foot pain. No abnormalities noted on right foot. Will give patient ibuprofen and referred to outpatient treatment.        Olivia Mackie, MD 03/15/12 [phone]

## 2012-03-15 NOTE — ED Notes (Addendum)
Pt arrived via EMS c/o RLE pain. Pt was seen at [facility] long ED for same complaint earlier today.  Pt confused to date but knows year. Pt is a resident of [facility]. Pt admits to history of smoking alcohol and THC. Last drink was yesterday. Pt is a poor historian on most questions about health. Also has pertinant Hx of R Achilles tendon rupture, RLE DVT, and schizophrenia.  Pts right toenail is mildly bloody and mildly TTP.

## 2012-04-09 IMAGING — CR DG ANKLE 2V *R*
2 series · 2 of 2 positions shown · non-contrast
Comparison: Right ankle radiographs performed 02/07/2011

CLINICAL DATA: Status post fall, with right ankle pain; has cast on
for an avulsion fracture at the posterior calcaneus.

RIGHT ANKLE - 2 VIEW

[view not recorded (1 of 2)]
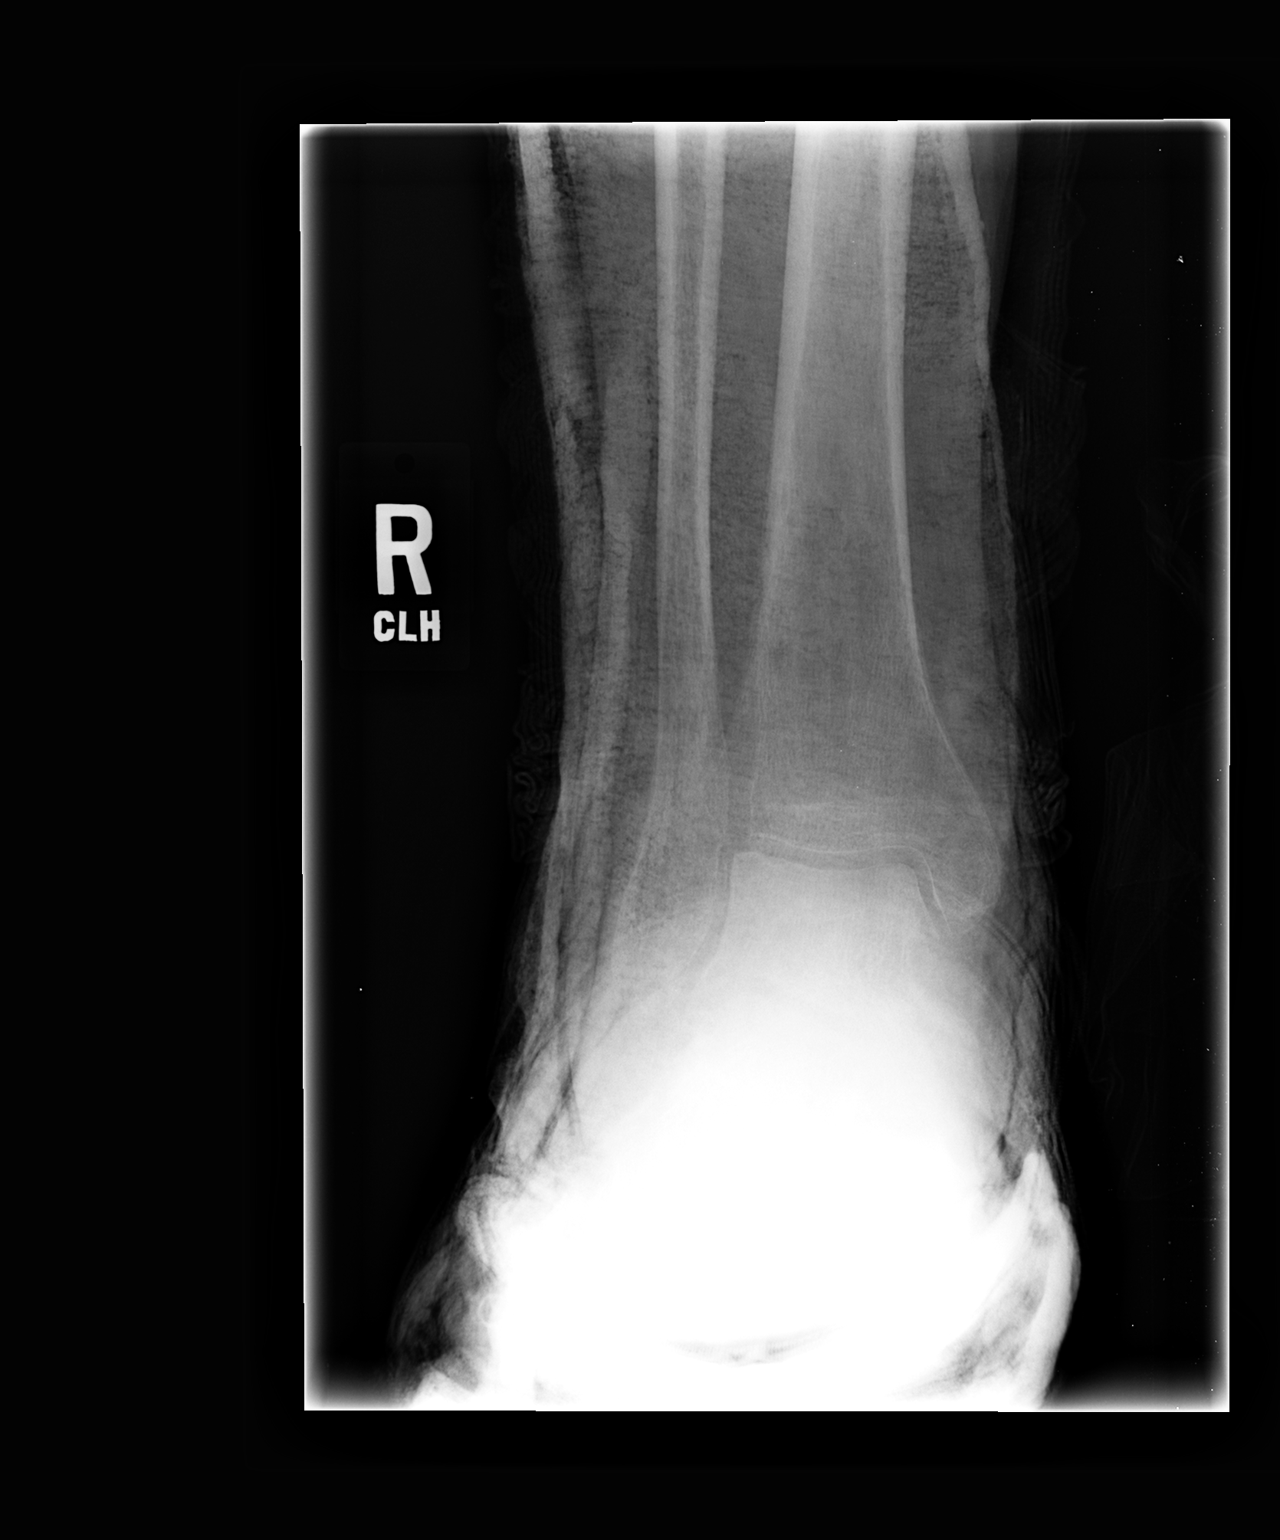

[view not recorded (2 of 2)]
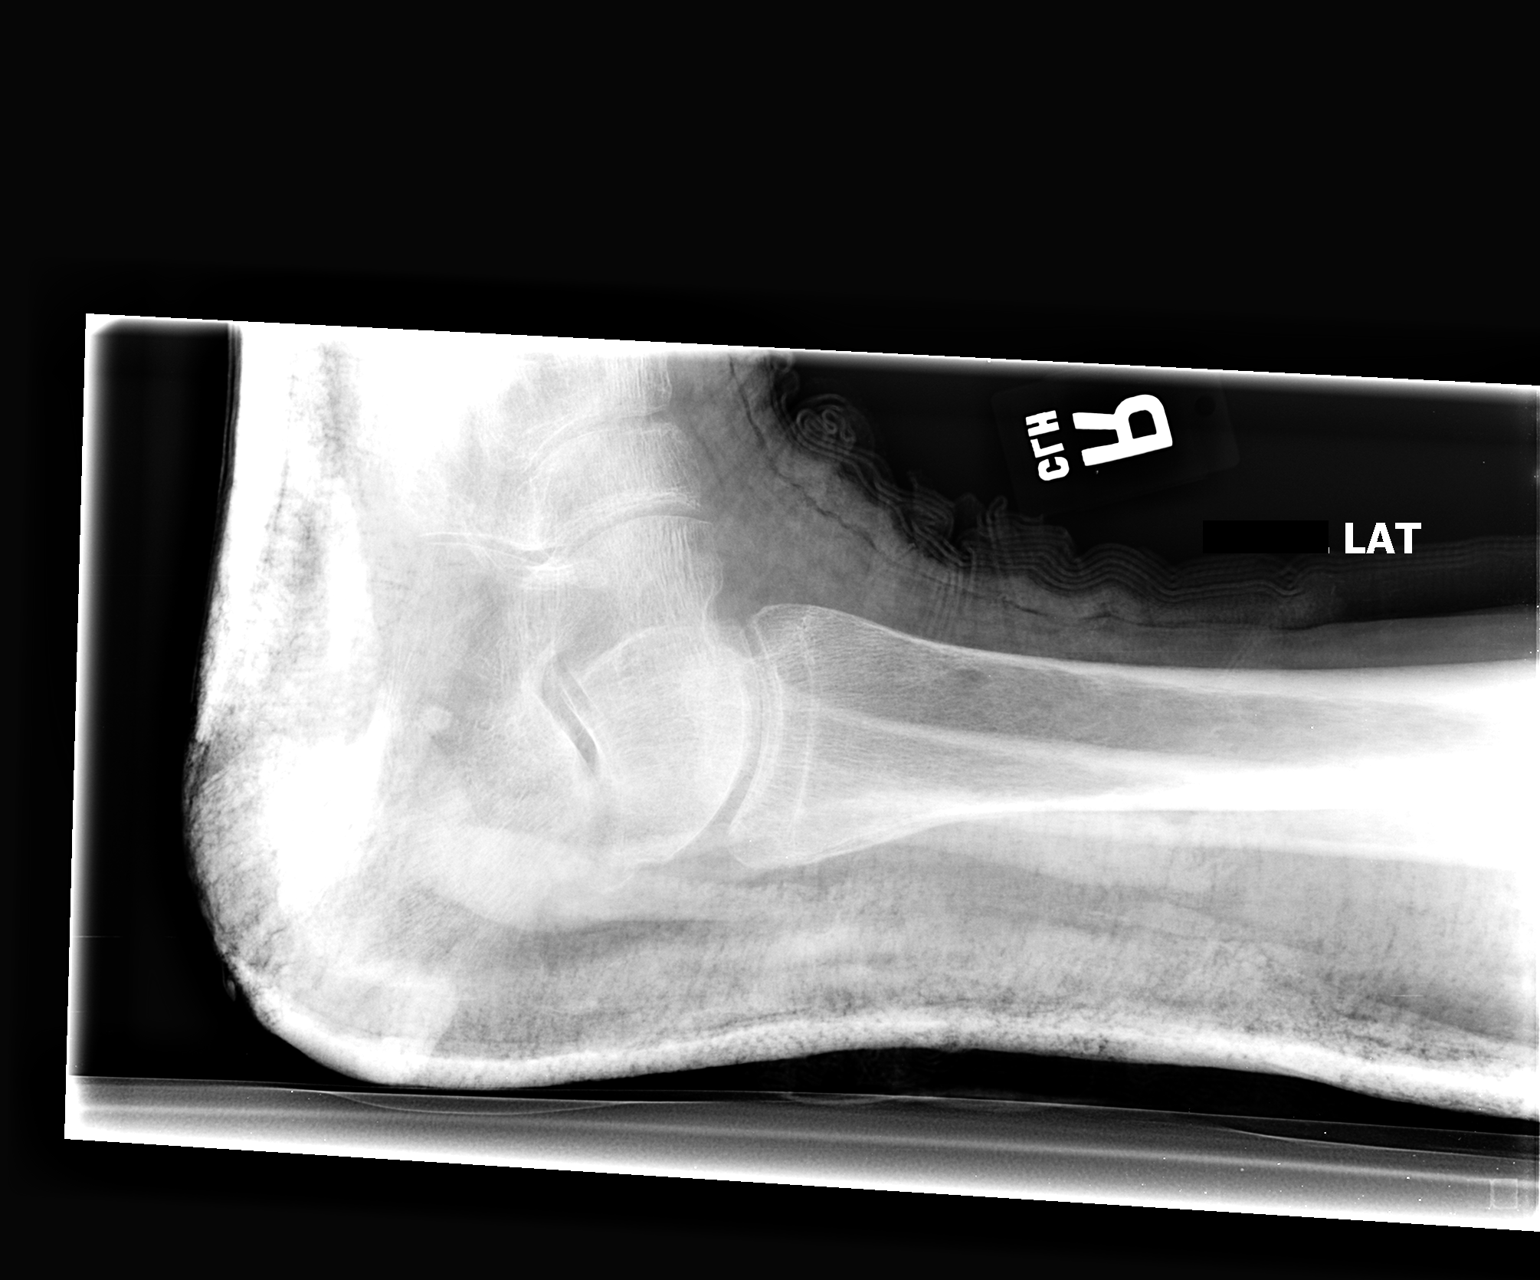

[2 of 2 positions shown; findings below may reference images not displayed]

FINDINGS: The previously noted fracture at the posterior aspect of
the calcaneus is not well assessed due to the overlying cast.  No
definite new fractures are seen.

The ankle mortise appears intact; the interosseous space is within
normal limits.  There is mild diffuse osteopenia of visualized
osseous structures.  Visualized joint spaces are preserved.

The soft tissues are not well assessed due to the overlying cast.
IMPRESSION: 1.  No definite new fractures seen.
2.  Known avulsion fracture at the posterior aspect of the
calcaneus, identified on prior radiographs, is not well assessed
due to the cast.
3.  Mild diffuse osteopenia of visualized osseous structures.

## 2013-12-31 DEATH — deceased

## 2014-10-25 NOTE — Consult Note (Signed)
PATIENT NAME:  [name], [name] MR#:  [id number] DATE OF BIRTH:  23-Jun-1956  DATE OF CONSULTATION:  10/31/2011  REFERRING PHYSICIAN:  Dr. Ponciano Ort  CONSULTING PHYSICIAN:  Drue Stager. Chelsa Stout, MD  REASON FOR CONSULTATION: Rule out mental condition.   HISTORY OF PRESENT ILLNESS: [name] is a 59 year old male presenting to the [facility] Emergency Department after undergoing referral to a homeless shelter and then being turned away from the homeless shelter. When he was seen in the Emergency Room it was noted that he had an odd affect and the undersigned confirms this upon interview. The patient does have poverty of speech with a flat nonresponsive affect given content in the interview, however, his thought process is normal as described in the mental status exam. His thought content is normal as described in the mental status exam. He also denies any history of hallucinations. His orientation and memory function are intact. He describes normal interest in fishing and listening to music.   He does acknowledge that he has not been able to maintain a job, income or residence.    PAST PSYCHIATRIC HISTORY: [name] does acknowledge that he used to smoke marijuana on a regular basis, however, he states that he has not smoked in 30 years. He does acknowledge that he has smoked cocaine off and on and has smoked within the last month (crack cocaine).   He denies any suicide attempts. He denies any experiences of elevated energy, decreased need for sleep or elevated mood. He does not give any history of major depression.   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 He then was stationed at [employer]. 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 He cannot provide the details. 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 Upon further interview using supportive psychotherapy the patient does describe further details about his Army separation process.   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 This explained his difficulty adapting to the [employer] and allowed him to be discharged honorably.   The patient does acknowledge that he talks to himself at times and the triage nurse heard him talking to himself after she left the exam room tonight.    He has been tried on Mellaril and Haldol in the past with no impact on his difficulty in adapting to work environments or social groups. They also had no impact on his vague speech.   FAMILY PSYCHIATRIC HISTORY: None known.   SOCIAL HISTORY: The patient's father was contacted by the Behavioral Medicine Emergency Department. The patient's father stated that the patient used to live with the father and the patient's mother, however, they had to eventually set limits and not let him live at home anymore. [name] has been homeless much of the time in the three years since he left their house. He has not been able to keep a job. He was incarcerated at one point after charged with trespassing somewhere in [facility]. He has never been married. He has no children.   ALLERGIES: No known drug allergies.   PAST MEDICAL HISTORY:  1. Fractured hip. 2. History of deep vein thrombosis, currently undergoing lower extremity Doppler evaluation.   MEDICATIONS:  No medications.   LABORATORY, DIAGNOSTIC AND RADIOLOGICAL DATA: Urine drug screen negative. TSH normal. Aspirin negative. Alcohol negative. Tylenol negative. WBC slightly increased at 10.9. Hemoglobin slightly decreased at 11.1. Platelet count significantly increased at 662. Creatinine is normal at 0.99.   REVIEW OF SYSTEMS: Constitutional, HEENT, mouth, neurologic, psychiatric,  cardiovascular, respiratory, gastrointestinal, genitourinary, skin, musculoskeletal, hematologic, lymphatic, endocrine, metabolic all unremarkable.   PHYSICAL EXAMINATION:  VITAL SIGNS: Temperature 98.1, pulse 69, respiratory rate 16, blood pressure 131/62.   GENERAL APPEARANCE: [name] is a middle-aged male lying in a partially reclined supine position in his hospital bed. He is cachectic in appearance. His muscle tone is decreased. He is alert. His eye contact is not direct initially but begins to become direct as the interview progresses. His concentration is normal. He is oriented to all spheres. His memory is intact to immediate, recent, and remote. His speech is initially impoverished then begins to pick up in amount as he progresses and discusses the [employer] as well as his interest in listening to different rock 'n roll bands. He does have a mildly flat prosody but there is no dysarthria. Thought process is logical, coherent, and goal directed for the most part, however, at times there is some vagueness and it is difficult to understand what he is trying to say, however, he is able to re-express it and reframe it when asked. Thought content: No thoughts of harming himself or others. No delusions or hallucinations. Insight is intact to the fact that he has been diagnosed with a personality disorder. His judgment is intact. His affect is flat. Mood is normal.   ASSESSMENT:  AXIS I: History of cocaine abuse.   AXIS II: Schizotypal personality disorder, provisional.   AXIS III: History of deep vein thrombosis.   AXIS IV: Economic, primary support group, general medical.   AXIS V: 55.   [name] is not at risk to harm himself or others. He agrees to call emergency services for any thoughts of harming himself, thoughts of harming others, or distress.   RECOMMENDATIONS: There is no indication for medication treatment in [name] as he provides history, schizotypal has not in general  responded to antipsychotic therapy.   However, once consistent residence is established for [name] he can benefit from counseling, such as his local county mental health center, for coping skills and interpersonal skills.   Substance abuse prevention program such as [facility] in this county or an analogous program in [place]. Also, he could be a candidate for a residential chemical dependence relapse prevention program depending upon the outcome of his general medical work-up.   12-step meetings and 12-step sponsor. The patient may initially be only comfortable with a 12-step sponsor but could become progressively comfortable with a consistent group.   ____________________________ Drue Stager. Johanthan Kneeland, MD jsw:cms D: 10/31/2011 22:42:05 ET T: 11/01/2011 06:19:52 ET JOB#: [id number]  cc: Drue Stager. Johnica Armwood, MD, <Dictator> Billie Ruddy MD ELECTRONICALLY SIGNED 11/09/2011 22:33
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
## 2014-10-25 NOTE — Discharge Summary (Signed)
PATIENT NAME:  [name], MCMAHILL MR#:  [id number] DATE OF BIRTH:  1956/03/12  DATE OF ADMISSION:  11/01/2011 DATE OF DISCHARGE:    PRIMARY CARE PHYSICIAN: None.   CHIEF COMPLAINT: I was unable to walk.   DISCHARGE DIAGNOSES:  1. Unsteady gait, likely in the setting of neuropathy and deconditioning.  2. History of alcohol abuse.  3. History of polysubstance abuse. 4. History of schizophrenia.   ACTIVITY: as tolerated DIET: regular  LABORATORY, RADIOLOGICAL AND DIAGNOSTIC DATA: BUN 23, creatinine 0.99, glucose 95 on arrival, sodium 139, potassium 3.7. CK total 245. TSH 3.15. Urine tox negative. WBC 10.9, hemoglobin 11.1, platelets 662. Vitamin B12 395. Salicylates and serum acetaminophen levels are not significantly elevated. CT of the head without contrast done on 04/30 showing no acute intracranial process. Ultrasound of the lower extremity on the right on 04/30 showing no thrombus. MRI of brain on 05/01 without contrast showing no evidence for acute ischemic or hemorrhagic event. Chronic small vessel ischemia. No mass effect. More atrophy diffusely than normal for expected age. 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 Variable degrees of neural foramina encroachment. No compression fractures.   HISTORY OF PRESENT ILLNESS/HOSPITAL COURSE: The patient is a 59 year old Caucasian male who has been in the ED since 04/29. Hospitalist services were contacted on 11/01/2011 after two days being in the ER for admission and evaluation for unsteady gait. While in the ER, he underwent extensive testing including CBC, BMP, ultrasound of the lower extremity, head CT, MRI of the brain and C-spine, all of which did not show any acute problem. She had unstable gait. For full details, please see the history and physical dictated on 11/01/2011 by Dr. Vianne Bulls. The patient was seen by psychiatrist while in the ED as  well as Dr. Loletta Specter from neurology. The patient was also seen by physical therapy. The patient was admitted for evaluation for short-term rehab. The patient had been followed by case management as well as Behavioral Medicine and physical therapy. The patient did well with physical therapy. Vitamin B12 levels were sent which was again normal. Currently, the patient is walking with a walker a distance greater than 200 feet. He has had stable vitals. His gait is steady with a walker. He was started on gabapentin for his neuropathy for a couple of days. He does not complain of pain. He was also started on some IV fluids. At this point, he ambulates well with a walker. He has had extensive testing and will be discharged to a homeless shelter today. In regards to his psych issues, he was seen by psychiatry and no medications were recommended for now. He is to follow up as an outpatient with a psychiatrist and physician.   CODE STATUS: FULL CODE.   TOTAL TIME SPENT: 35 minutes.   ____________________________ Vivien Presto, MD sa:ap D: 11/03/2011 11:43:05 ET T: 11/03/2011 13:46:34 ET JOB#: [id number]  cc: Vivien Presto, MD, <Dictator> Vivien Presto MD ELECTRONICALLY SIGNED 11/15/2011 17:55

## 2014-10-25 NOTE — H&P (Signed)
PATIENT NAME:  [name], [name] MR#:  [id number] DATE OF BIRTH:  June 16, 1956  DATE OF ADMISSION:  11/01/2011  PRIMARY CARE PHYSICIAN:  None.  ER PHYSICIAN: Dr. Roxine Caddy.   CHIEF COMPLAINT: Unable to walk.   HISTORY OF PRESENT ILLNESS: The patient is a 59 year old male with history of hip fracture and also deep vein thrombosis who was brought in from [facility] as he went to shelter in [place] and they felt that he was unstable when he walks and called the [facility] and they brought him here. The patient was in the [place] and then he actually drove to [employer] where he was found to have unstable gait and they called the police department and he came. The patient is here in the Emergency Room from 04/29 and he had a lot of blood work including CBC, BMP, ultrasound of lower extremities, head CT, MRA of brain, MRI of C-spine. All of them were essentially not showing much of acute problems. The patient was seen by psychiatrist and also Dr. Loletta Specter. Main issue is he having unstable gait. The patient says that he had vomiting two days before. No abdominal pain. No diarrhea. No cough. No fever. 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 The patient was seen by physical therapy who recommended rolling walker and he did walk with rolling walker 60 feet without any assistance. Dr. Roxine Caddy asked me to evaluate for short-term rehab. Case managers have been following the patient every day. The patient has no insurance and no payor source to get rehab and I spoke with the physical therapist today. She told me that the patient is able to walk 60 feet without help, but with the help of rolling walker and she strongly suggested that the patient has no motivation to walk, but if he has been encouraged, he was able to walk 50 feet with rolling walker. He did not have any loss of balance at the end of the ambulation.   PAST MEDICAL  HISTORY:  1. History of hip surgery.  2. History of deep vein thrombosis before.  3. Achilles tendinitis.   SOCIAL HISTORY: Previous smoker and also previous alcoholic. Last drink was three weeks ago. He is homeless almost for three years.  He had history of marijuana use.   ALLERGIES: No known allergies.   PAST SURGICAL HISTORY:  Hip surgery.   MEDICATIONS: None.   REVIEW OF SYSTEMS: The patient denies any fever, has some fatigue. EYES: No blurred vision. ENT: No tinnitus. No epistaxis. No difficulty swallowing. RESPIRATORY: No cough. No wheezing. GASTROINTESTINAL: Has vomiting on the day of admission, but subsided. No abdominal pain. No constipation or diarrhea. GENITOURINARY: No dysuria. ENDOCRINE: Had no polyuria or nocturia. HEMATOLOGIC: No anemia or easy bruising. INTEGUMENT: No skin rashes. MUSCULOSKELETAL: No back pain or leg pain. NEUROLOGIC: The patient denies any history of transient ischemic attack or seizures. PSYCH: No anxiety.   PHYSICAL EXAMINATION:  VITAL SIGNS: Temperature 97.4 on 04/29, today 97.3, pulse on 04/29 was 95, today 70, blood pressure 140/74 today.  GENERAL: He is an alert, awake, oriented middle-aged man lying in the hospital bed in no distress, cachectic in appearance. Speech is impoverished, but able to have a legible conversation and flat affect. Has no dysarthria.   HEENT: Head atraumatic, normocephalic. Pupils equally reacting to light. Extraocular movements are intact. No conjunctivitis. Hearing is intact. Dentition is very poor.   NECK: No thyroid enlargement. No lymphadenopathy.   RESPIRATORY:  Clear to auscultation. No wheeze. No rales.   CARDIOVASCULAR: S1 and S2 regular. No murmurs.   ABDOMEN: Soft. Bowel sounds present. No organomegaly, nontender.   MUSCULOSKELETAL: Strength is 5 x 5 in upper and lower extremities.   SKIN: No skin rashes.   LYMPH NODES: No lymphadenopathy.   NEUROLOGIC: He has good power bilateral upper and lower  extremities. Cranial nerves are intact.   PSYCH: Oriented to time, place, person and some flat affect.   LABORATORY, DIAGNOSTIC AND RADIOLOGICAL DATA: TSH 4.53, salicylates less than 1.7. Acetaminophen less than 2. WBC 10.9, hemoglobin 11.1, hematocrit 34.7, and platelets 662. Electrolytes: Sodium 139, potassium 3.7, chloride 105, bicarbonate 26, BUN 23, creatinine 0.9 and glucose 95. CK total 245. CT of the head showed no acute intracranial process. Ultrasound of lower extremity showed no evidence of deep vein thrombosis in the right femoral or popliteal. Urine toxicology is negative. MRA of the brain showed no evidence of ischemia or hemorrhagic event. White matter changes because of chronic vessel ischemia and this MRA has been rechecked Dr. Loletta Specter. No intracranial mass affect. There appears to be cerebral atrophy. MRI of the cervical spine showed no evidence of demyelinating process or signal abnormalities. Moderate spinal stenosis at C3-C4, C5-C6 and C6-C7 and no evidence of compression fracture or signal abnormality of the vertebral bodies.   ASSESSMENT AND PLAN: This patient is a 59 year old male in the ER for three days because of balance issues, had all the blood work done and seen by psychiatrist and neurologist who think no active psych issues or acute neurological issues that needs admission, but because of balance issues I was asked to admit for short-term rehab. The patient has been seen by physical therapy today also. The patient did walk with a rolling walker up to 50 feet without any help and I spoke with the physical therapist who saw the patient. She said keeping the patient in the hospital for three days is not going to change his balance issues. Rather arranging for physical therapy if possible is a more appropriate treatment. The patient will be actually placed in observation and see if case manager can arrange for physical therapy for him for his balance issues and if that cannot be  arranged, he needs to go to group home with a rolling walker. The case managers have tried to reach [facility], but when called it is after hours and they are not available. The patient is unsafe for discharge cause of his balance issues.  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 Of note, his CK is elevated yesterday on 04/30 to 245 and he is getting normal saline 100 mL/h since yesterday. I am going to discontinue IV fluids.      TOTAL TIME SPENT: More than 60 minutes on speaking with ER physician, Dr. Loletta Specter, case managers and also going through all the paperwork.    ____________________________ Epifanio Lesches, MD sk:ap D: 11/01/2011 17:27:55 ET T: 11/02/2011 08:00:32 ET JOB#: [id number]  cc: Epifanio Lesches, MD, <Dictator> Epifanio Lesches MD ELECTRONICALLY SIGNED 11/11/2011 23:02
